# Patient Record
Sex: Female | Born: 1960 | Race: White | Hispanic: No | Marital: Married | Smoking: Former smoker
Health system: Southern US, Community
[De-identification: ages and names within clinical notes are randomized; demographics above are authoritative.]

## PROBLEM LIST (undated history)

## (undated) DIAGNOSIS — T7840XA Allergy, unspecified, initial encounter: Secondary | ICD-10-CM

## (undated) DIAGNOSIS — E559 Vitamin D deficiency, unspecified: Secondary | ICD-10-CM

## (undated) DIAGNOSIS — K5790 Diverticulosis of intestine, part unspecified, without perforation or abscess without bleeding: Secondary | ICD-10-CM

## (undated) DIAGNOSIS — J449 Chronic obstructive pulmonary disease, unspecified: Secondary | ICD-10-CM

## (undated) DIAGNOSIS — E538 Deficiency of other specified B group vitamins: Secondary | ICD-10-CM

## (undated) HISTORY — PX: ANKLE SURGERY: SHX546

## (undated) HISTORY — DX: Deficiency of other specified B group vitamins: E53.8

## (undated) HISTORY — PX: ABDOMINAL HYSTERECTOMY: SHX81

## (undated) HISTORY — DX: Allergy, unspecified, initial encounter: T78.40XA

## (undated) HISTORY — PX: CERVICAL SPINE SURGERY: SHX589

## (undated) HISTORY — DX: Vitamin D deficiency, unspecified: E55.9

## (undated) HISTORY — PX: SHOULDER SURGERY: SHX246

---

## 1998-03-12 ENCOUNTER — Other Ambulatory Visit: Admission: RE | Admit: 1998-03-12 | Discharge: 1998-03-12 | Payer: Self-pay | Admitting: Obstetrics and Gynecology

## 1998-07-12 ENCOUNTER — Ambulatory Visit (HOSPITAL_COMMUNITY): Admission: RE | Admit: 1998-07-12 | Discharge: 1998-07-12 | Payer: Self-pay | Admitting: Neurosurgery

## 1998-10-30 ENCOUNTER — Encounter: Payer: Self-pay | Admitting: Emergency Medicine

## 1998-10-30 ENCOUNTER — Emergency Department (HOSPITAL_COMMUNITY): Admission: EM | Admit: 1998-10-30 | Discharge: 1998-10-30 | Payer: Self-pay | Admitting: Emergency Medicine

## 1999-01-14 ENCOUNTER — Ambulatory Visit (HOSPITAL_BASED_OUTPATIENT_CLINIC_OR_DEPARTMENT_OTHER): Admission: RE | Admit: 1999-01-14 | Discharge: 1999-01-14 | Payer: Self-pay | Admitting: Orthopaedic Surgery

## 1999-03-17 ENCOUNTER — Other Ambulatory Visit: Admission: RE | Admit: 1999-03-17 | Discharge: 1999-03-17 | Payer: Self-pay | Admitting: Obstetrics and Gynecology

## 2000-04-16 ENCOUNTER — Other Ambulatory Visit: Admission: RE | Admit: 2000-04-16 | Discharge: 2000-04-16 | Payer: Self-pay | Admitting: Obstetrics and Gynecology

## 2000-08-14 ENCOUNTER — Encounter: Payer: Self-pay | Admitting: Neurosurgery

## 2000-08-14 ENCOUNTER — Ambulatory Visit (HOSPITAL_COMMUNITY): Admission: RE | Admit: 2000-08-14 | Discharge: 2000-08-14 | Payer: Self-pay | Admitting: Neurosurgery

## 2000-08-24 ENCOUNTER — Inpatient Hospital Stay (HOSPITAL_COMMUNITY): Admission: RE | Admit: 2000-08-24 | Discharge: 2000-08-26 | Payer: Self-pay | Admitting: Neurosurgery

## 2000-08-24 ENCOUNTER — Encounter: Payer: Self-pay | Admitting: Neurosurgery

## 2000-09-07 ENCOUNTER — Encounter: Admission: RE | Admit: 2000-09-07 | Discharge: 2000-09-07 | Payer: Self-pay | Admitting: Neurosurgery

## 2000-09-07 ENCOUNTER — Encounter: Payer: Self-pay | Admitting: Neurosurgery

## 2000-10-06 ENCOUNTER — Encounter: Admission: RE | Admit: 2000-10-06 | Discharge: 2000-10-06 | Payer: Self-pay | Admitting: Neurosurgery

## 2000-10-06 ENCOUNTER — Encounter: Payer: Self-pay | Admitting: Neurosurgery

## 2000-12-27 ENCOUNTER — Encounter: Payer: Self-pay | Admitting: Neurosurgery

## 2000-12-27 ENCOUNTER — Encounter: Admission: RE | Admit: 2000-12-27 | Discharge: 2000-12-27 | Payer: Self-pay | Admitting: Neurosurgery

## 2001-04-19 ENCOUNTER — Other Ambulatory Visit: Admission: RE | Admit: 2001-04-19 | Discharge: 2001-04-19 | Payer: Self-pay | Admitting: Obstetrics and Gynecology

## 2002-06-29 ENCOUNTER — Other Ambulatory Visit: Admission: RE | Admit: 2002-06-29 | Discharge: 2002-06-29 | Payer: Self-pay | Admitting: Obstetrics and Gynecology

## 2002-11-17 ENCOUNTER — Ambulatory Visit (HOSPITAL_COMMUNITY): Admission: RE | Admit: 2002-11-17 | Discharge: 2002-11-17 | Payer: Self-pay | Admitting: Obstetrics and Gynecology

## 2002-11-17 ENCOUNTER — Encounter: Payer: Self-pay | Admitting: Obstetrics and Gynecology

## 2003-08-16 ENCOUNTER — Other Ambulatory Visit: Admission: RE | Admit: 2003-08-16 | Discharge: 2003-08-16 | Payer: Self-pay | Admitting: Obstetrics and Gynecology

## 2004-10-13 ENCOUNTER — Other Ambulatory Visit: Admission: RE | Admit: 2004-10-13 | Discharge: 2004-10-13 | Payer: Self-pay | Admitting: Obstetrics and Gynecology

## 2005-11-25 ENCOUNTER — Other Ambulatory Visit: Admission: RE | Admit: 2005-11-25 | Discharge: 2005-11-25 | Payer: Self-pay | Admitting: Obstetrics and Gynecology

## 2006-02-03 ENCOUNTER — Encounter: Payer: Self-pay | Admitting: Neurosurgery

## 2008-12-22 ENCOUNTER — Encounter: Admission: RE | Admit: 2008-12-22 | Discharge: 2008-12-22 | Payer: Self-pay | Admitting: Podiatry

## 2012-08-01 ENCOUNTER — Other Ambulatory Visit: Payer: Self-pay | Admitting: Obstetrics and Gynecology

## 2012-08-01 DIAGNOSIS — R928 Other abnormal and inconclusive findings on diagnostic imaging of breast: Secondary | ICD-10-CM

## 2012-08-09 ENCOUNTER — Ambulatory Visit
Admission: RE | Admit: 2012-08-09 | Discharge: 2012-08-09 | Disposition: A | Discharge status: Home / Self Care | Payer: Self-pay | Source: Ambulatory Visit | Attending: Obstetrics and Gynecology | Admitting: Obstetrics and Gynecology

## 2012-08-09 ENCOUNTER — Ambulatory Visit
Admission: RE | Admit: 2012-08-09 | Discharge: 2012-08-09 | Disposition: A | Discharge status: Home / Self Care | Payer: BC Managed Care – PPO | Source: Ambulatory Visit | Attending: Obstetrics and Gynecology | Admitting: Obstetrics and Gynecology

## 2012-08-09 DIAGNOSIS — R928 Other abnormal and inconclusive findings on diagnostic imaging of breast: Secondary | ICD-10-CM

## 2014-03-13 ENCOUNTER — Ambulatory Visit (INDEPENDENT_AMBULATORY_CARE_PROVIDER_SITE_OTHER): Payer: BC Managed Care – PPO

## 2014-03-13 ENCOUNTER — Ambulatory Visit (INDEPENDENT_AMBULATORY_CARE_PROVIDER_SITE_OTHER): Payer: BC Managed Care – PPO | Admitting: Internal Medicine

## 2014-03-13 VITALS — BP 134/84 | HR 87 | Temp 97.5°F | Resp 16 | Ht 63.0 in | Wt 154.0 lb

## 2014-03-13 DIAGNOSIS — M79676 Pain in unspecified toe(s): Secondary | ICD-10-CM

## 2014-03-13 DIAGNOSIS — Z23 Encounter for immunization: Secondary | ICD-10-CM

## 2014-03-13 DIAGNOSIS — S91109A Unspecified open wound of unspecified toe(s) without damage to nail, initial encounter: Secondary | ICD-10-CM

## 2014-03-13 DIAGNOSIS — M79609 Pain in unspecified limb: Secondary | ICD-10-CM

## 2014-03-13 MED ORDER — HYDROCODONE-ACETAMINOPHEN 5-325 MG PO TABS: 1.0000 | ORAL_TABLET | Freq: Three times a day (TID) | ORAL | Status: DC | PRN

## 2014-03-13 NOTE — Progress Notes (Signed)
   Subjective:    Patient ID: Deborah May, female    DOB: 1961-03-19, 53 y.o.   MRN: [id number]  HPI 53 year old female presents for evaluation of a puncture wound of her left 4th toe. Injury occurred today while setting up for a yard sale.  She dropped the wooden sign and a screw landed directly on her toe nail. There was pain and bleeding immediately. Admits her toe feels slightly "numb" and she is having pain with ROM.   Last tetanus unknown.   Review of Systems  Musculoskeletal: Positive for arthralgias.  Skin: Positive for wound.  Neurological: Positive for weakness and numbness.       Objective:   Physical Exam  Constitutional: She is oriented to person, place, and time. She appears well-developed and well-nourished.  HENT:  Head: Normocephalic and atraumatic.  Right Ear: External ear normal.  Left Ear: External ear normal.  Eyes: Conjunctivae are normal.  Neck: Normal range of motion.  Cardiovascular: Normal rate.   Pulmonary/Chest: Effort normal.  Musculoskeletal:  Slight TTP of left 4th toe.  +puncture wound of nail with bright red bloody drainage. No subungual hematoma. Capillary refill normal <2 seconds.   Neurological: She is alert and oriented to person, place, and time.  Psychiatric: She has a normal mood and affect. Her behavior is normal. Judgment and thought content normal.      [facility] reading (PRIMARY) by  Dr. Perrin Maltese as no acute bony abnormality or FB noted in the 4th toe.       Assessment & Plan:   Toe pain - Plan: DG Toe 4th Left, HYDROcodone-acetaminophen (NORCO) 5-325 MG per tablet  Wound, open, toe  Placed in post op shoe for comfort.  Tdap updated Recommend warm, soapy soaks 2-3 times daily RTC precautions discussed.  Follow up if symptoms worsening or fail to improve.

## 2014-07-08 ENCOUNTER — Ambulatory Visit (INDEPENDENT_AMBULATORY_CARE_PROVIDER_SITE_OTHER): Payer: BC Managed Care – PPO

## 2014-07-08 ENCOUNTER — Ambulatory Visit (INDEPENDENT_AMBULATORY_CARE_PROVIDER_SITE_OTHER): Payer: BC Managed Care – PPO | Admitting: Emergency Medicine

## 2014-07-08 VITALS — BP 130/80 | HR 75 | Temp 98.5°F | Resp 20 | Ht 63.25 in | Wt 150.2 lb

## 2014-07-08 DIAGNOSIS — J189 Pneumonia, unspecified organism: Secondary | ICD-10-CM

## 2014-07-08 DIAGNOSIS — R05 Cough: Secondary | ICD-10-CM

## 2014-07-08 DIAGNOSIS — R059 Cough, unspecified: Secondary | ICD-10-CM

## 2014-07-08 MED ORDER — PROMETHAZINE-CODEINE 6.25-10 MG/5ML PO SYRP: 5.0000 mL | ORAL_SOLUTION | Freq: Four times a day (QID) | ORAL | Status: DC | PRN

## 2014-07-08 MED ORDER — LEVOFLOXACIN 500 MG PO TABS
500.0000 mg | ORAL_TABLET | Freq: Every day | ORAL | Status: AC
Start: 2014-07-08 — End: 2014-07-18

## 2014-07-08 NOTE — Progress Notes (Signed)
[facility] [address], [place] [address] [phone]  Date:  07/08/2014   Name:  Deborah May   DOB:  1961-04-11   MRN:  [id number]  PCP:  No PCP Per Patient    Chief Complaint: URI and Itchy Scalp   History of Present Illness:  Deborah May is a 53 y.o. very pleasant female patient who presents with the following:  Ill this week with nasal congestion and post nasal drainage.  Temp to 102.3.   Has a cough, occasional wheezing, and exertional shortness of breath. Cough productive purulent sputum. Malaise, fatigue.  No nausea or vomiting.  No stool change No rash.  No improvement with over the counter medications or other home remedies.  Denies other complaint or health concern today.   There are no active problems to display for this patient.   History reviewed. No pertinent past medical history.  Past Surgical History  Procedure Laterality Date  . Abdominal hysterectomy    . Ankle surgery    . Cervical spine surgery      History  Substance Use Topics  . Smoking status: Current Every Day Smoker -- 1.00 packs/day    Types: Cigarettes  . Smokeless tobacco: Never Used  . Alcohol Use: Yes     Comment: occ glass of wine    Family History  Problem Relation Age of Onset  . Congestive Heart Failure Father   . Cancer Sister     Allergies  Allergen Reactions  . Other     Mycins and cyclins per patient     Medication list has been reviewed and updated.  Current Outpatient Prescriptions on File Prior to Visit  Medication Sig Dispense Refill  . loratadine (CLARITIN) 10 MG tablet Take 10 mg by mouth daily.      . Cholecalciferol (VITAMIN D3) 3000 UNITS TABS Take by mouth.      Marland Kitchen. HYDROcodone-acetaminophen (NORCO) 5-325 MG per tablet Take 1 tablet by mouth every 8 (eight) hours as needed.  20 tablet  0   No current facility-administered medications on file prior to visit.    Review of Systems:  As per HPI, otherwise negative.     Physical Examination: Filed Vitals:   07/08/14 1231  BP: 130/80  Pulse: 75  Temp: 98.5 F (36.9 C)  Resp: 20   Filed Vitals:   07/08/14 1231  Height: 5' 3.25" (1.607 m)  Weight: 150 lb 4 oz (68.153 kg)   Body mass index is 26.39 kg/(m^2). Ideal Body Weight: Weight in (lb) to have BMI = 25: 142  GEN: WDWN, NAD, Non-toxic, A & O x 3.  Persistent cough.   HEENT: Atraumatic, Normocephalic. Neck supple. No masses, No LAD. Ears and Nose: No external deformity. CV: RRR, No M/G/R. No JVD. No thrill. No extra heart sounds. PULM: CTA B, no wheezes, crackles, rhonchi. No retractions. No resp. distress. No accessory muscle use. ABD: S, NT, ND, +BS. No rebound. No HSM. EXTR: No c/c/e NEURO Normal gait.  PSYCH: Normally interactive. Conversant. Not depressed or anxious appearing.  Calm demeanor.    Assessment and Plan: Pneumonia levaquin prometh c codeine   Signed,  Phillips OdorJeffery Jarrah Babich, MD   [facility] reading (PRIMARY) by  Dr. Dareen Pianoanderson. Right infiltrate.

## 2014-07-08 NOTE — Patient Instructions (Signed)

## 2014-07-21 ENCOUNTER — Telehealth: Payer: Self-pay

## 2014-07-21 ENCOUNTER — Ambulatory Visit (INDEPENDENT_AMBULATORY_CARE_PROVIDER_SITE_OTHER): Payer: BC Managed Care – PPO | Admitting: Family Medicine

## 2014-07-21 VITALS — BP 136/80 | HR 92 | Temp 97.5°F | Resp 16 | Ht 63.5 in | Wt 150.2 lb

## 2014-07-21 DIAGNOSIS — J189 Pneumonia, unspecified organism: Secondary | ICD-10-CM

## 2014-07-21 DIAGNOSIS — B850 Pediculosis due to Pediculus humanus capitis: Secondary | ICD-10-CM

## 2014-07-21 MED ORDER — PERMETHRIN 5 % EX CREA: 1.0000 "application " | TOPICAL_CREAM | Freq: Once | CUTANEOUS | Status: DC

## 2014-07-21 MED ORDER — LEVOFLOXACIN 750 MG PO TABS: 750.0000 mg | ORAL_TABLET | Freq: Every day | ORAL | Status: DC

## 2014-07-21 NOTE — Progress Notes (Signed)
Subjective:    Patient ID: Deborah May, female    DOB: 02-01-1961, 53 y.o.   MRN: [id number]  Sinusitis Associated symptoms include congestion, coughing and sinus pressure. Pertinent negatives include no chills, diaphoresis, ear pain, headaches, shortness of breath or sore throat.   Chief Complaint  Patient presents with  . Sinusitis    sinus congestion, chest congestion.  cough with congestion--clear.  treated herself x 2 for head lice.  feels she still has this.     This chart was scribed for Ethelda ChickKristi M Shams Fill, MD, MD by Andrew Auaven Small, ED Scribe. This patient was seen in room 13 and the patient's care was started at 5:17 PM.  HPI Comments: Deborah May is a 53 y.o. female who [facility]/u regarding pneumonia. Pt was seen 10/4 by Dr. Dareen PianoAnderson and diagnoses with pneumonia confirmed with CXR. Pt was prescribed levaquin 500 mg daily for ten days. Pt states she was getting significantly better from pneumonia but her husband was sick and she had recently stayed in the hospital over night visiting a friend which she believes triggered sinusitis x 1 week. She reports sinus congestion, chest congestion, and cough consisting of white/clear sputum. Pt states she ran out levaquin one week ago.  Pt denies fever, SOB wheezing, sore throat. Pt smokes 4 cigarettes a day but used to smoke .5 - 1 pack a day. Pt denies chronic health problems.  She feels that she has a new illness.  She is resistant to undergo repeat CXR.  No recent fevers in the past week.   Pt also complains of improving head lice that's began 2 weeks ago. 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 Pt states she was diagnosed by the school nurse. Pt states she has treated lice with multiple product for the past 2 weeks with anti itch shampoo, nix, mayonnaise, cetaphil and selsun blue. She is also constantly washing her sheets and clothes. Pt last treated head with  Nix 2 days ago but her husband removed 2 lice from head this morning. Pt states she wakes up during the night and is constantly scratching her head. She reports she has worsening itching to posterior neck and behind ears.   Past Medical History  Diagnosis Date  . Allergy    Past Surgical History  Procedure Laterality Date  . Abdominal hysterectomy    . Ankle surgery    . Cervical spine surgery     Prior to Admission medications   Medication Sig Start Date End Date Taking? Authorizing Provider  promethazine-codeine (PHENERGAN WITH CODEINE) 6.25-10 MG/5ML syrup Take 5-10 mLs by mouth every 6 (six) hours as needed. 07/08/14  Yes Carmelina DaneJeffery S Anderson, MD  Cholecalciferol (VITAMIN D3) 3000 UNITS TABS Take by mouth.    Historical Provider, MD  HYDROcodone-acetaminophen (NORCO) 5-325 MG per tablet Take 1 tablet by mouth every 8 (eight) hours as needed. 03/13/14   Heather Jaquita RectorM Marte, PA-C  loratadine (CLARITIN) 10 MG tablet Take 10 mg by mouth daily.    Historical Provider, MD  penicillin v potassium (VEETID) 500 MG tablet Take 500 mg by mouth every 6 (six) hours.    Historical Provider, MD   Review of Systems  Constitutional: Negative for fever, chills, diaphoresis and fatigue.  HENT: Positive for congestion, rhinorrhea, sinus pressure and voice change. Negative for ear pain and sore throat.   Respiratory: Positive for cough. Negative for shortness of  breath, wheezing and stridor.   Gastrointestinal: Negative for nausea, vomiting and diarrhea.  Neurological: Negative for headaches.   Objective:   Physical Exam  Nursing note and vitals reviewed. Constitutional: She is oriented to person, place, and time. She appears well-developed and well-nourished. No distress.  HENT:  Head: Normocephalic and atraumatic.  Right Ear: External ear normal.  Left Ear: External ear normal.  Nose: Nose normal. Right sinus exhibits no maxillary sinus tenderness and no frontal sinus tenderness. Left sinus exhibits no  maxillary sinus tenderness and no frontal sinus tenderness.  Mouth/Throat: Oropharynx is clear and moist.  No lice or nits visualized.   Eyes: Conjunctivae and EOM are normal.  Neck: Neck supple.  Cardiovascular: Normal rate, regular rhythm and normal heart sounds.  Exam reveals no gallop and no friction rub.   No murmur heard. Pulmonary/Chest: Effort normal and breath sounds normal. No respiratory distress. She has no wheezes. She has no rales. She exhibits no tenderness.  Musculoskeletal: Normal range of motion.  Lymphadenopathy:    She has no cervical adenopathy.  Neurological: She is alert and oriented to person, place, and time.  Skin: Skin is warm and dry. No rash noted. She is not diaphoretic. No erythema.  +excoriations posterior neck.  Psychiatric: She has a normal mood and affect. Her behavior is normal.     Assessment & Plan:   1. CAP (community acquired pneumonia)   2. Head lice    1. Community acquired pneumonia: improving but recent worsening symptoms or suffering with new acute illness. Pt declined repeat CXR today; pt was advised to undergo repeat CXR in upcoming 2-3 months to confirm resolution of pneumonia especially with tobacco hx/active use.  Rx for Levaquin 750mg  daily for seven days.  Recommend Mucinex DM bid. 2.  Head lice: New. Rx for Elimite cream % provided to apply to scalp and hair.  S/p OTC Nix shampoo x 2.  Call if no improvement in two weeks.   Meds ordered this encounter  Medications  . levofloxacin (LEVAQUIN) 750 MG tablet    Sig: Take 1 tablet (750 mg total) by mouth daily.    Dispense:  7 tablet    Refill:  0  . DISCONTD: permethrin (ELIMITE) 5 % cream    Sig: Apply 1 application topically once. Leave in place for eight hours and then wash off.  Repeat in 1-2 weeks if necessary.    Dispense:  60 g    Refill:  0  . permethrin (ELIMITE) 5 % cream    Sig: Apply 1 application topically once. Leave in place for eight hours and then wash off.  Repeat  in 1-2 weeks if necessary.    Dispense:  60 g    Refill:  0     I personally performed the services described in this documentation, which was scribed in my presence. The recorded information has been reviewed and is accurate.  Nilda SimmerKristi Kyrian Stage, M.D.  [facility]  [place] [address] [place], [place]  [address] [phone] phone [phone] fax

## 2014-07-21 NOTE — Telephone Encounter (Signed)
Patient called stating pharmacist told her to call and see if we could prescribe another bottle of Elimite since she has long hair and the one tube might not be enough. Dr. Katrinka BlazingSmith approved sent in another RX. Patient notified and voiced understanding.

## 2014-07-21 NOTE — Patient Instructions (Signed)
1. RECOMMEND MUCINEX DM ONE TABLET TWICE DAILY. 2. CONTINUE MUCINEX NIGHTTIME. 3. RETURN IN 2-3 MONTHS FOR REPEAT CHEST XRAY TO CONFIRM THAT PNEUMONIA HAS COMPLETELY RESOLVED.

## 2014-07-21 NOTE — Telephone Encounter (Signed)
°   permethrin (ELIMITE) 5 % cream Patient was advised by pharmacist to get a second tube of medication due to long hair.    [phone] (M)   [facility] [address]

## 2014-07-30 ENCOUNTER — Telehealth: Payer: Self-pay | Admitting: Family Medicine

## 2014-07-30 DIAGNOSIS — B850 Pediculosis due to Pediculus humanus capitis: Secondary | ICD-10-CM

## 2014-07-30 MED ORDER — PERMETHRIN 5 % EX CREA: 1.0000 "application " | TOPICAL_CREAM | Freq: Once | CUTANEOUS | Status: DC

## 2014-07-30 NOTE — Telephone Encounter (Signed)
Patient had lice. States that more eggs have hatched and her head will not stop itching. Patient used Nix to help with this but is requesting another refill on her medication. Please advise. [facility] [address]  [phone]

## 2014-07-30 NOTE — Telephone Encounter (Signed)
rx sent

## 2014-07-30 NOTE — Telephone Encounter (Signed)
Pt calling to get the refill on Elimite cream- told to repeat in 1 week and no refill was given at the pharmacy.  Please advise.

## 2014-07-30 NOTE — Telephone Encounter (Signed)
Pt advised.

## 2016-01-02 ENCOUNTER — Telehealth: Payer: Self-pay | Admitting: Genetic Counselor

## 2016-01-02 ENCOUNTER — Encounter: Payer: Self-pay | Admitting: Genetic Counselor

## 2016-01-02 NOTE — Telephone Encounter (Signed)
Verified insurance and address, faxed referring letter, mailed new pt packet, scheduled intake

## 2016-01-30 ENCOUNTER — Telehealth: Payer: Self-pay | Admitting: Genetic Counselor

## 2016-01-30 NOTE — Telephone Encounter (Signed)
Returned pt call regarding genetic counseling insurance questions and concerns for her upcoming appt.

## 2016-02-11 ENCOUNTER — Ambulatory Visit (HOSPITAL_BASED_OUTPATIENT_CLINIC_OR_DEPARTMENT_OTHER): Payer: BLUE CROSS/BLUE SHIELD | Admitting: Genetic Counselor

## 2016-02-11 ENCOUNTER — Other Ambulatory Visit: Payer: BLUE CROSS/BLUE SHIELD

## 2016-02-11 DIAGNOSIS — Z801 Family history of malignant neoplasm of trachea, bronchus and lung: Secondary | ICD-10-CM

## 2016-02-11 DIAGNOSIS — Z8041 Family history of malignant neoplasm of ovary: Secondary | ICD-10-CM

## 2016-02-11 DIAGNOSIS — Z803 Family history of malignant neoplasm of breast: Secondary | ICD-10-CM

## 2016-02-11 DIAGNOSIS — Z800 Family history of malignant neoplasm of digestive organs: Secondary | ICD-10-CM

## 2016-02-11 DIAGNOSIS — Z315 Encounter for genetic counseling: Secondary | ICD-10-CM

## 2016-02-11 DIAGNOSIS — Z808 Family history of malignant neoplasm of other organs or systems: Secondary | ICD-10-CM | POA: Diagnosis not present

## 2016-02-12 ENCOUNTER — Encounter: Payer: Self-pay | Admitting: Genetic Counselor

## 2016-02-12 DIAGNOSIS — Z803 Family history of malignant neoplasm of breast: Secondary | ICD-10-CM | POA: Insufficient documentation

## 2016-02-12 DIAGNOSIS — Z8041 Family history of malignant neoplasm of ovary: Secondary | ICD-10-CM | POA: Insufficient documentation

## 2016-02-12 NOTE — Progress Notes (Signed)
REFERRING PROVIDER: Arvella Nigh, MD [address], [place] [address]  PRIMARY PROVIDER:  No PCP Per Patient  PRIMARY REASON FOR VISIT:  1. Family history of ovarian cancer   2. Family history of breast cancer in female   3. Family history of cancer of GI tract   4. Family history of skin cancer   5. Family history of liver cancer   6. Family history of lung cancer      HISTORY OF PRESENT ILLNESS:   Deborah May, a 55 y.o. female, was seen for a [place] cancer genetics consultation at the request of Dr. Radene Knee due to a family history of ovarian, breast, appendiceal, and other cancers.  Deborah May had negative BRCAvantage testing (sequencing and deletion/duplication analysis of BRCA1/2 genes) in January 2017 through [facility], as ordered by her doctor's office.  Deborah May presents to clinic today to discuss the possibility of a hereditary predisposition to cancer, further genetic testing options, and to further clarify her future cancer risks, as well as potential cancer risks for family members.    Deborah May is a 55 y.o. female with no personal history of cancer.    HORMONAL RISK FACTORS:  Menarche was at age 40.  First live birth at age 29.  OCP use for approximately 3-4 years.  Ovaries intact: yes.  Hysterectomy: yes, at the age of 23 due to endometriosis and abnormal bleeding.  Menopausal status: postmenopausal.  HRT use: 0 years. Colonoscopy: yes; most recent was approx 1.5-78yr ago and two polyps were removed at that time. Mammogram within the last year: yes; history of cysts with aspirations Number of breast biopsies: 0. Up to date with pelvic exams:  yes. Any excessive radiation exposure in the past:  no, but does report additional history of secondhand smoke exposure  Past Medical History  Diagnosis Date  . Allergy     Past Surgical History  Procedure Laterality Date  . Abdominal hysterectomy    . Ankle surgery    .  Cervical spine surgery      Social History   Social History  . Marital Status: Married    Spouse Name: N/A  . Number of Children: N/A  . Years of Education: N/A   Social History Main Topics  . Smoking status: Current Every Day Smoker -- 0.50 packs/day for 38 years    Types: Cigarettes  . Smokeless tobacco: Never Used     Comment: never quite 1ppd  . Alcohol Use: Yes     Comment: occ glass of wine/ 3-4 drinks per month  . Drug Use: No  . Sexual Activity: Not Asked   Other Topics Concern  . None   Social History Narrative     FAMILY HISTORY:  We obtained a detailed, 4-generation family history.  Significant diagnoses are listed below: Family History  Problem Relation Age of Onset  . Congestive Heart Failure Father 737 . Cancer Sister 590   appendiceal  . ALS Mother   . Ovarian cancer Maternal Aunt 80  . Ovarian cancer Maternal Grandmother 67  . Stroke Paternal Grandmother   . Angina Paternal Grandmother   . Lung cancer Paternal Grandfather     smoker; d. 567s-60s . Breast cancer Other 33  . Stroke Maternal Aunt   . Mesothelioma Maternal Uncle     [place]60 . Skin cancer Cousin     maternal 1st cousin w/ NOS skin cancer  .  Breast cancer Cousin 71    maternal 1st cousin  . Liver cancer Cousin     maternal 1st cousin d. late 52s; +cirrhosis from alcohol abuse  . Endometriosis Cousin     s/p TAH  . Other Cousin     hx of benign teratoma  . Alzheimer's disease Other     paternal great aunt; late-onset  . Heart attack Other     (x2 paternal great uncles) and one paternal great aunt in their 56s-80s    Deborah May has one daughter who is currently 59 and has never had cancer.  Her daughter does have a history of an abnormal spot of skin between her toes that was frozen.  Deborah May has one young grandson and granddaughter.  Deborah May full sister was diagnosed with appendiceal cancer at 70 and passed away at 37.  Her sister had  two daughters, one of whom was diagnosed with breast cancer at 55.  Deborah May mother died of ALS at the age of 87.  Her father died of congestive heart failure at 19.  Deborah May mother had three full sisters and two full brothers.  One sister died of ovarian cancer at the age of 57.  She had four daughters and one son, and two of her daughters have had cancer.  One of her daughters was diagnosed with breast cancer at the age of 52 and the other was diagnosed with an unspecified type of skin cancer.  Two other of Deborah May's maternal aunts are in their late 71s-early 80s and have not had cancer.  One of these aunts has a son who died of liver cancer in his late 50s.  He had liver cirrhosis due to alcohol abuse.  Deborah May maternal uncles have passed away--one died in a car accident in his early 52s; the other died of mesothelioma close to the age of 47.  Deborah May maternal grandmother was diagnosed with ovarian cancer at 26 and passed away at 103.  Deborah May maternal grandfather died of alcohol abuse in his 53s.  Deborah May reports either an uncle or cousin on her maternal grandfather's side of the family with brain cancer, but she is not quite sure how this relative is related to her.  She has no further information for maternal great aunts/uncles or great grandparents.  Deborah May father had one full sister who is currently 53 and who has never had cancer.  She has two daughters and one son who are all cancer free.  One daughter has a history of endometriosis for which she underwent a hysterectomy, and she also has a history of a benign teratoma.  Deborah May paternal grandmother died of stroke and angina at the age of 69.  Deborah May reports that her grandmother has one sister who is still alive at 25, has never had cancer and who has alzheimer's.  Her grandmother also had another sister and two brothers, all of whom died of heart attacks in their 47s-80s.  Deborah May  grandfather, a smoker, died of lung cancer in his 35s-60s.    Patient's maternal ancestors are of [place], [place], and Native American descent, and paternal ancestors are of [place], [place], and Native American- [place] descent. There is no reported Ashkenazi Jewish ancestry. There is no known consanguinity.  GENETIC COUNSELING ASSESSMENT: YASLYN CUMBY is a 55 y.o. female with a family history of breast and ovarian cancer which is somewhat suggestive of a hereditary breast/ovarian cancer syndrome and predisposition  to cancer. We, therefore, discussed and recommended the following at today's visit.   DISCUSSION: We reviewed the characteristics, features and inheritance patterns of hereditary cancer syndromes, such as those caused by mutations within the BRIP1 and Lynch syndrome genes. We discussed that we would expect testing of the BRCA1 and BRCA2 genes to be negative upon genetic testing once again.  We also discussed genetic testing, including the appropriate family members to test, the process of testing, insurance coverage and cost of testing, and turn-around-time for results. We discussed the implications of a negative, positive and/or variant of uncertain significant result. We recommended Ms. Marcos pursue genetic testing for the 30-gene Hereditary Cancer Panel through [facility].  The 30-gene Panel through [facility] [place], [place]) includes sequencing and/or deletion/duplication analysis of the following genes: APC, ATM, BAP1, BARD1, BMPR1A, BRCA1, BRCA2, BRIP1, CDH1, CDK4, CDKN2A, CHEK2, EPCAM, GREM1, MITF, MLH1, MSH2, MSH6, MUTYH, NBN, PALB2, PMS2, POLD1, POLE, PTEN, RAD51C, RAD51D, SMAD4, STK11, and TP53.   Based on Ms. Gaultney's family history of cancer, she meets medical criteria for genetic testing. [facility] offers an up-front, self-pay genetic testing option.  Testing through [facility] is discounted for the month of May, so Ms.  Raphael's out-of-pocket cost is approximately $109.  Ms. Abplanalp provided consent for testing and a saliva sample was sent to [facility].    PLAN: After considering the risks, benefits, and limitations, Ms. Fayson  provided informed consent to pursue genetic testing and the saliva sample was sent to [facility] for analysis of the 30-gene Hereditary Cancer Panel. Results should be available within approximately 3-4 weeks' time, at which point they will be disclosed by telephone to Ms. Estis, as will any additional recommendations warranted by these results. Ms. Raupp will receive a summary of her genetic counseling visit and a copy of her results once available. This information will also be available in Epic. We encouraged Ms. Musolf to remain in contact with cancer genetics annually so that we can continuously update the family history and inform her of any changes in cancer genetics and testing that may be of benefit for her family. Ms. Maiden questions were answered to her satisfaction today. Our contact information was provided should additional questions or concerns arise.  Thank you for the referral and allowing [place] to share in the care of your patient.   Jeanine Luz, MS, [facility] Certified Genetic Counselor [email or web address] Phone: [phone]  The patient was seen for a total of 65 minutes in face-to-face genetic counseling.  This patient was discussed with Drs. Magrinat, Lindi Adie and/or Burr Medico who agrees with the above.    _______________________________________________________________________ For Office Staff:  Number of people involved in session: 1 Was an Intern/ student involved with case: no

## 2016-02-28 ENCOUNTER — Telehealth: Payer: Self-pay | Admitting: Genetic Counselor

## 2016-02-28 NOTE — Telephone Encounter (Signed)
Discussed with Deborah May that her genetic test results were negative for any known pathogenic mutations within any of 30 genes on the [facility] Hereditary Cancer Panel ([place] in [place], [place]) that would increase her genetic risk for breast, ovarian, colon, or other related cancers.  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  Discussed that we treat this just like a negative test result until it gets updated by the lab.  She should keep her phone number up-to-date with [place], so that we can call and let her know if/when this gets updated.  Discussed that her niece and maternal first cousin who have a history of breast cancer should have more comprehensive genetic testing, as they have only had negative BRCA testing, thus far.  There could be a mutation in the family that explains the family history of breast and ovarian cancer that Deborah May herself just did not inherit.  More comprehensive testing for these affected relatives will give [place] more information about Deborah May's own cancer risks.  Additionally, if this VUS is found in other affected family members, it may make this result more suspicious for [place].  Discussed that she should continue to follow her doctors' recommendations for future cancer screening.  Women in the family are at an increased risk for breast and ovarian cancer based on the family history.  Deborah May would like a copy of her results mailed, so I will send that along with a results letter.  She is welcome to call with any questions.  I also encouraged her to let her relatives know that they could contact me to make appointments/to ask any questions they may have.

## 2016-03-04 ENCOUNTER — Ambulatory Visit: Payer: Self-pay | Admitting: Genetic Counselor

## 2016-03-04 ENCOUNTER — Encounter: Payer: Self-pay | Admitting: Genetic Counselor

## 2016-03-04 DIAGNOSIS — Z809 Family history of malignant neoplasm, unspecified: Secondary | ICD-10-CM

## 2016-03-04 DIAGNOSIS — Z803 Family history of malignant neoplasm of breast: Secondary | ICD-10-CM

## 2016-03-04 DIAGNOSIS — Z1379 Encounter for other screening for genetic and chromosomal anomalies: Secondary | ICD-10-CM | POA: Insufficient documentation

## 2016-03-04 DIAGNOSIS — Z8041 Family history of malignant neoplasm of ovary: Secondary | ICD-10-CM

## 2016-03-04 NOTE — Progress Notes (Signed)
GENETIC TEST RESULT  HPI: Ms. Wohlfarth was previously seen in the [facility] due to a family history of ovarian, breast, and other cancers,and concerns regarding a hereditary predisposition to cancer. Please refer to our prior cancer genetics clinic note from Feb 11, 2016 for more information regarding Ms. Rahe's medical, social and family histories, and our assessment and recommendations, at the time. Ms. Buccieri recent genetic test results were disclosed to her, as were recommendations warranted by these results. These results and recommendations are discussed in more detail below.  GENETIC TEST RESULTS: At the time of Ms. Haglund's visit on 02/11/16, we recommended she pursue more comprehensive genetic testing through the 30-gene Hereditary Cancer Panel through [facility].  The 30-gene Panel through [facility] [place], [place]) includes sequencing and/or deletion/duplication analysis of the following genes: APC, ATM, BAP1, BARD1, BMPR1A, BRCA1, BRCA2, BRIP1, CDH1, CDK4, CDKN2A, CHEK2, EPCAM, GREM1, MITF, MLH1, MSH2, MSH6, MUTYH, NBN, PALB2, PMS2, POLD1, POLE, PTEN, RAD51C, RAD51D, SMAD4, STK11, and TP53.  Those results are now back, the report date for which is Feb 28, 2016.  Genetic testing was normal, and did not reveal a deleterious mutation in these genes. 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  The test report will be scanned into EPIC and will be located under the Results Review tab in the Pathology>Molecular Pathology section.   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 At this time, it is unknown if this VUS is associated with an increased risk for cancer or if this is a normal finding. Since this VUS result is uncertain, it cannot help guide screening recommendations, and family members should not  be tested for this VUS to help define their own cancer risks.  Also, we all have variants within our genes that make [place] unique individuals--most of these variants are benign.  Thus, we treat this VUS as a negative result.   With time, we suspect the lab will reclassify this variant and when they do, we will try to re-contact Ms. Grondin to discuss the reclassification further.  We also encouraged Ms. Tusing to contact us in a year or two to obtain an update on the status of this VUS.  We discussed with Ms. Kessinger that since the current genetic testing is not perfect, it is possible there may be a gene mutation in one of these genes that current testing cannot detect, but that chance is small. We also discussed, that it is possible that another gene that has not yet been discovered, or that we have not yet tested, is responsible for the cancer diagnoses in the family, and it is, therefore, important to remain in touch with cancer genetics in the future so that we can continue to offer Ms. Massingale the most up-to-date genetic testing.   CANCER SCREENING RECOMMENDATIONS: We still do not have an explanation for the family history of breast and ovarian cancer. This normal result may be reassuring and indicate that Ms. Marten does not likely have an increased risk of cancer due to a mutation in one of these genes.  Further genetic testing affected relatives would be helpful to further elucidate Ms. Parzych's own cancer risks.   There is a chance that there is a genetic mutation in the family that explains the family history of cancer, that Ms. Gotwalt herself just did not inherit.  Additionally, if  other affected family members have genetic testing and are found to also carry this same BRIP1 VUS, then this VUS may become more suspicious for [place]. We, therefore, recommend further genetic testing of affected relatives and, in the meantime, we recommend Ms. Thai continue to follow the cancer screening  guidelines provided by her primary healthcare providers.   RECOMMENDATIONS FOR FAMILY MEMBERS: Women in this family might be at some increased risk of developing cancer, over the general population risk, simply due to the family history of cancer. We recommended women in this family have a yearly mammogram beginning at age 36, or 47 years younger than the earliest onset of breast cancer in their close family, an annual clinical breast exam, and perform monthly breast self-exams. Women in this family should also have a gynecological exam as recommended by their primary provider. All family members should have a colonoscopy by age 33.  Based on Ms. Stamper's family history, we recommended her niece, who was diagnosed with breast cancer at age 73 and her maternal first cousin who was diagnosed with breast cancer at 78 (and whose mother died of ovarian cancer), have genetic counseling and testing.  Ms. Liberman reports that these family members have likely already had negative BRCA1/2 testing, however, we recommend that they have more comprehensive panel testing.  Ms. Sterbenz will let us know if we can be of any assistance in coordinating genetic counseling and/or testing for these family members.   FOLLOW-UP: Lastly, we discussed with Ms. Sedano that cancer genetics is a rapidly advancing field and it is possible that new genetic tests will be appropriate for her and/or her family members in the future. We encouraged her to remain in contact with cancer genetics on an annual basis so we can update her personal and family histories and let her know of advances in cancer genetics that may benefit this family.   Our contact number was provided. Ms. Gamel questions were answered to her satisfaction, and she knows she is welcome to call us at anytime with additional questions or concerns.   Jeanine Luz, MS, [facility] Certified Genetic Counselor [email or web address] Phone: [phone]

## 2018-10-25 ENCOUNTER — Ambulatory Visit
Admission: RE | Admit: 2018-10-25 | Discharge: 2018-10-25 | Disposition: A | Discharge status: Home / Self Care | Payer: BLUE CROSS/BLUE SHIELD | Source: Ambulatory Visit | Attending: Internal Medicine | Admitting: Internal Medicine

## 2018-10-25 ENCOUNTER — Other Ambulatory Visit: Payer: Self-pay | Admitting: Internal Medicine

## 2018-10-25 DIAGNOSIS — R059 Cough, unspecified: Secondary | ICD-10-CM

## 2018-10-25 DIAGNOSIS — R05 Cough: Secondary | ICD-10-CM

## 2018-11-02 ENCOUNTER — Other Ambulatory Visit: Payer: Self-pay | Admitting: Obstetrics and Gynecology

## 2018-11-02 DIAGNOSIS — R928 Other abnormal and inconclusive findings on diagnostic imaging of breast: Secondary | ICD-10-CM

## 2018-11-09 ENCOUNTER — Ambulatory Visit
Admission: RE | Admit: 2018-11-09 | Discharge: 2018-11-09 | Disposition: A | Discharge status: Home / Self Care | Payer: BLUE CROSS/BLUE SHIELD | Source: Ambulatory Visit | Attending: Obstetrics and Gynecology | Admitting: Obstetrics and Gynecology

## 2018-11-09 DIAGNOSIS — R928 Other abnormal and inconclusive findings on diagnostic imaging of breast: Secondary | ICD-10-CM

## 2019-03-03 ENCOUNTER — Other Ambulatory Visit: Payer: Self-pay | Admitting: Internal Medicine

## 2019-03-03 ENCOUNTER — Ambulatory Visit
Admission: RE | Admit: 2019-03-03 | Discharge: 2019-03-03 | Disposition: A | Discharge status: Home / Self Care | Payer: BLUE CROSS/BLUE SHIELD | Source: Ambulatory Visit | Attending: Internal Medicine | Admitting: Internal Medicine

## 2019-03-03 DIAGNOSIS — R0609 Other forms of dyspnea: Secondary | ICD-10-CM

## 2019-03-30 ENCOUNTER — Other Ambulatory Visit: Payer: Self-pay

## 2019-03-30 ENCOUNTER — Ambulatory Visit (INDEPENDENT_AMBULATORY_CARE_PROVIDER_SITE_OTHER): Payer: BC Managed Care – PPO | Admitting: Emergency Medicine

## 2019-03-30 ENCOUNTER — Encounter: Payer: Self-pay | Admitting: Emergency Medicine

## 2019-03-30 VITALS — BP 116/86 | HR 69 | Ht 63.0 in | Wt 147.2 lb

## 2019-03-30 DIAGNOSIS — J449 Chronic obstructive pulmonary disease, unspecified: Secondary | ICD-10-CM | POA: Insufficient documentation

## 2019-03-30 DIAGNOSIS — J301 Allergic rhinitis due to pollen: Secondary | ICD-10-CM

## 2019-03-30 DIAGNOSIS — J309 Allergic rhinitis, unspecified: Secondary | ICD-10-CM | POA: Insufficient documentation

## 2019-03-30 DIAGNOSIS — R0600 Dyspnea, unspecified: Secondary | ICD-10-CM | POA: Diagnosis not present

## 2019-03-30 NOTE — Assessment & Plan Note (Signed)
I suspect she does have COPD based on the constellation of symptoms associated with her recent allergy flare, sinusitis.  She also had some hyperinflation on chest x-ray.  Her cyclical seasonal episodes of bronchitis are also consistent.  We will perform pulmonary function testing to quantify her degree of obstruction.  Talked her today about possibly starting maintenance bronchodilator therapy.  This will largely be based on symptoms.  She is already done the most important thing to prevent more rapid progression which is stopping smoking.  I congratulated her on this.  Congratulations on stopping smoking.  This is probably the most important thing you can do for your health.  We will make every effort to ensure that you do not restart. We will perform pulmonary function testing to evaluate for COPD. We will obtain a copy of your chest x-ray for review Follow with Dr. Lamonte Sakai next available after your PFT to review

## 2019-03-30 NOTE — Progress Notes (Signed)
Subjective:    Patient ID: Zebedee IbaJill G Schneiderman, female    DOB: [birth year], 58 y.o.   MRN: [id number]  HPI 58 year old former smoker (30 pack years) with a history of allergic rhinitis, formerly on immunotherapy.  Review of the chart notes that she was treated for a community-acquired pneumonia back in 2015.  She is here today for evaluation of COPD. She had an episode about a month ago with dyspnea when she woke up, UE tingling. Self limited but she had a second similar episode a few days later. Some chest tightness, difficulty moving air through nose, sinuses. CXR was reported as clear. She was treated with levaquin by Dr Kevan NyGates and her head, breathing both improved.   She notes that she had stopped her loratadine about a week before. Restarted now. She stopped smoking with this episode. She has been on albuterol before in past when she had PNA.   She has had episodes of bronchitis with seasonal changes over the years when her allergies flare   Review of Systems  Constitutional: Negative for fever and unexpected weight change.  HENT: Negative for congestion, dental problem, ear pain, nosebleeds, postnasal drip, rhinorrhea, sinus pressure, sneezing, sore throat and trouble swallowing.   Eyes: Negative for redness and itching.  Respiratory: Positive for cough and shortness of breath. Negative for chest tightness and wheezing.   Cardiovascular: Negative for palpitations and leg swelling.  Gastrointestinal: Negative for nausea and vomiting.  Genitourinary: Negative for dysuria.  Musculoskeletal: Negative for joint swelling.  Skin: Negative for rash.  Neurological: Negative for headaches.  Hematological: Does not bruise/bleed easily.  Psychiatric/Behavioral: Negative for dysphoric mood. The patient is not nervous/anxious.     Past Medical History:  Diagnosis Date  . Allergy      Family History  Problem Relation Age of Onset  . Congestive Heart Failure Father 7279  . Cancer Sister 5958    appendiceal  . ALS Mother   . Ovarian cancer Maternal Aunt 80  . Ovarian cancer Maternal Grandmother 1367  . Stroke Paternal Grandmother   . Angina Paternal Grandmother   . Lung cancer Paternal Grandfather        smoker; d. 7050s-60s  . Breast cancer Other 33  . Stroke Maternal Aunt   . Mesothelioma Maternal Uncle        worked in [employer]; d. approx 80  . Skin cancer Cousin        maternal 1st cousin w/ NOS skin cancer  . Breast cancer Cousin 1551       maternal 1st cousin  . Liver cancer Cousin        maternal 1st cousin d. late 4640s; +cirrhosis from alcohol abuse  . Endometriosis Cousin        s/p TAH  . Other Cousin        hx of benign teratoma  . Alzheimer's disease Other        paternal great aunt; late-onset  . Heart attack Other        (x2 paternal great uncles) and one paternal great aunt in their 6370s-80s     Social History   Socioeconomic History  . Marital status: Married    Spouse name: Not on file  . Number of children: Not on file  . Years of education: Not on file  . Highest education level: Not on file  Occupational History  . Not on file  Social Needs  . Financial resource strain: Not on file  . Food insecurity  Worry: Not on file    Inability: Not on file  . Transportation needs    Medical: Not on file    Non-medical: Not on file  Tobacco Use  . Smoking status: Former Smoker    Packs/day: 0.75    Years: 40.00    Pack years: 30.00    Types: Cigarettes    Quit date: 02/21/2019    Years since quitting: 0.1  . Smokeless tobacco: Never Used  . Tobacco comment: never quite 1ppd  Substance and Sexual Activity  . Alcohol use: Yes    Comment: occ glass of wine/ 3-4 drinks per month  . Drug use: No  . Sexual activity: Not on file  Lifestyle  . Physical activity    Days per week: Not on file    Minutes per session: Not on file  . Stress: Not on file  Relationships  . Social Herbalist on phone: Not on file    Gets together: Not  on file    Attends religious service: Not on file    Active member of club or organization: Not on file    Attends meetings of clubs or organizations: Not on file    Relationship status: Not on file  . Intimate partner violence    Fear of current or ex partner: Not on file    Emotionally abused: Not on file    Physically abused: Not on file    Forced sexual activity: Not on file  Other Topics Concern  . Not on file  Social History Narrative  . 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   Allergies  Allergen Reactions  . Cephalosporins   . Macrolides And Ketolides   . Other     Mycins and cyclins per patient   . Tetracyclines & Related      Outpatient Medications Prior to Visit  Medication Sig Dispense Refill  . Cholecalciferol (VITAMIN D3) 50 MCG (2000 UT) capsule Take 2,000 Units by mouth daily.    Marland Kitchen loratadine (CLARITIN) 10 MG tablet Take 10 mg by mouth daily.    . Multiple Vitamin (MULTIVITAMIN) tablet Take 1 tablet by mouth daily.    . vitamin B-12 (CYANOCOBALAMIN) 1000 MCG tablet Take 1,000 mcg by mouth daily.    . Cholecalciferol (VITAMIN D3) 3000 UNITS TABS Take by mouth.    Marland Kitchen HYDROcodone-acetaminophen (NORCO) 5-325 MG per tablet Take 1 tablet by mouth every 8 (eight) hours as needed. 20 tablet 0  . levofloxacin (LEVAQUIN) 750 MG tablet Take 1 tablet (750 mg total) by mouth daily. 7 tablet 0  . penicillin v potassium (VEETID) 500 MG tablet Take 500 mg by mouth every 6 (six) hours.    . permethrin (ELIMITE) 5 % cream Apply 1 application topically once. Leave in place for eight hours and then wash off.  Repeat in 1-2 weeks if necessary. 60 g 1  . promethazine-codeine (PHENERGAN WITH CODEINE) 6.25-10 MG/5ML syrup Take 5-10 mLs by mouth every 6 (six) hours as needed. 120 mL 0   No facility-administered medications prior to visit.         Objective:   Physical Exam Vitals:   03/30/19 1140  BP:  116/86  Pulse: 69  SpO2: 95%  Weight: 147 lb 3.2 oz (66.8 kg)  Height: 5\' 3"  (1.6 m)   Gen: Pleasant, well-nourished, in no distress,  normal affect  ENT: No  lesions,  mouth clear,  oropharynx clear, no postnasal drip  Neck: No JVD, no stridor  Lungs: No use of accessory muscles, no crackles or wheezing on normal respiration, no wheeze on forced expiration  Cardiovascular: RRR, heart sounds normal, no murmur or gallops, no peripheral edema  Musculoskeletal: No deformities, no cyanosis or clubbing  Neuro: alert, awake, non focal  Skin: Warm, no lesions or rash      Assessment & Plan:  COPD (chronic obstructive pulmonary disease) (HCC) I suspect she does have COPD based on the constellation of symptoms associated with her recent allergy flare, sinusitis.  She also had some hyperinflation on chest x-ray.  Her cyclical seasonal episodes of bronchitis are also consistent.  We will perform pulmonary function testing to quantify her degree of obstruction.  Talked her today about possibly starting maintenance bronchodilator therapy.  This will largely be based on symptoms.  She is already done the most important thing to prevent more rapid progression which is stopping smoking.  I congratulated her on this.  Congratulations on stopping smoking.  This is probably the most important thing you can do for your health.  We will make every effort to ensure that you do not restart. We will perform pulmonary function testing to evaluate for COPD. We will obtain a copy of your chest x-ray for review Follow with Dr. Delton CoombesByrum next available after your PFT to review  Allergic rhinitis Continue loratadine.  She may need to start taking this every day instead of just seasonally.  Consider addition of [place], nasal steroid going forward depending on symptoms.  Levy Pupaobert Faryn Sieg, MD, PhD 03/30/2019, 12:18 PM [facility] [phone] or if no answer [phone]

## 2019-03-30 NOTE — Patient Instructions (Addendum)
Congratulations on stopping smoking.  This is probably the most important thing you can do for your health.  We will make every effort to ensure that you do not restart. We will perform pulmonary function testing to evaluate for COPD. We will obtain a copy of your chest x-ray for review Agree with restarting loratadine 10 mg.  You probably need to take this every day. Follow with Dr. Lamonte Sakai next available after your PFT to review

## 2019-03-30 NOTE — Assessment & Plan Note (Signed)
Continue loratadine.  She may need to start taking this every day instead of just seasonally.  Consider addition of [place], nasal steroid going forward depending on symptoms.

## 2019-05-18 ENCOUNTER — Other Ambulatory Visit: Payer: Self-pay | Admitting: Emergency Medicine

## 2019-05-27 ENCOUNTER — Other Ambulatory Visit (HOSPITAL_COMMUNITY)
Admission: RE | Admit: 2019-05-27 | Discharge: 2019-05-27 | Disposition: A | Discharge status: Home / Self Care | Payer: BC Managed Care – PPO | Source: Ambulatory Visit | Attending: Emergency Medicine | Admitting: Emergency Medicine

## 2019-05-27 DIAGNOSIS — Z01812 Encounter for preprocedural laboratory examination: Secondary | ICD-10-CM | POA: Diagnosis not present

## 2019-05-27 DIAGNOSIS — Z20828 Contact with and (suspected) exposure to other viral communicable diseases: Secondary | ICD-10-CM | POA: Diagnosis not present

## 2019-05-28 LAB — SARS CORONAVIRUS 2 (TAT 6-24 HRS): SARS Coronavirus 2: NEGATIVE

## 2019-05-30 ENCOUNTER — Ambulatory Visit (INDEPENDENT_AMBULATORY_CARE_PROVIDER_SITE_OTHER): Payer: BC Managed Care – PPO | Admitting: Emergency Medicine

## 2019-05-30 ENCOUNTER — Encounter: Payer: Self-pay | Admitting: Emergency Medicine

## 2019-05-30 ENCOUNTER — Other Ambulatory Visit: Payer: Self-pay

## 2019-05-30 VITALS — BP 122/76 | HR 74 | Ht 63.0 in | Wt 151.0 lb

## 2019-05-30 DIAGNOSIS — J449 Chronic obstructive pulmonary disease, unspecified: Secondary | ICD-10-CM

## 2019-05-30 DIAGNOSIS — R0600 Dyspnea, unspecified: Secondary | ICD-10-CM

## 2019-05-30 LAB — PULMONARY FUNCTION TEST
DL/VA % pred: 105 %
DL/VA: 4.49 ml/min/mmHg/L
DLCO unc % pred: 102 %
DLCO unc: 20.02 ml/min/mmHg
FEF 25-75 Post: 1.13 L/sec
FEF 25-75 Pre: 0.86 L/sec
FEF2575-%Change-Post: 30 %
FEF2575-%Pred-Post: 48 %
FEF2575-%Pred-Pre: 36 %
FEV1-%Change-Post: 9 %
FEV1-%Pred-Post: 70 %
FEV1-%Pred-Pre: 64 %
FEV1-Post: 1.75 L
FEV1-Pre: 1.59 L
FEV1FVC-%Change-Post: 3 %
FEV1FVC-%Pred-Pre: 79 %
FEV6-%Change-Post: 5 %
FEV6-%Pred-Post: 87 %
FEV6-%Pred-Pre: 83 %
FEV6-Post: 2.73 L
FEV6-Pre: 2.58 L
FEV6FVC-%Change-Post: 0 %
FEV6FVC-%Pred-Post: 103 %
FEV6FVC-%Pred-Pre: 103 %
FVC-%Change-Post: 6 %
FVC-%Pred-Post: 85 %
FVC-%Pred-Pre: 80 %
FVC-Post: 2.73 L
FVC-Pre: 2.58 L
Post FEV1/FVC ratio: 64 %
Post FEV6/FVC ratio: 100 %
Pre FEV1/FVC ratio: 62 %
Pre FEV6/FVC Ratio: 100 %
RV % pred: 138 %
RV: 2.66 L
TLC % pred: 108 %
TLC: 5.32 L

## 2019-05-30 MED ORDER — ALBUTEROL SULFATE HFA 108 (90 BASE) MCG/ACT IN AERS: 1.0000 | INHALATION_SPRAY | Freq: Four times a day (QID) | RESPIRATORY_TRACT | 1 refills | Status: DC | PRN

## 2019-05-30 NOTE — Progress Notes (Signed)
Full PFT performed today. °

## 2019-05-30 NOTE — Progress Notes (Signed)
Subjective:    Patient ID: Deborah May, female    DOB: 07/24/1961, 58 y.o.   MRN: [id number]  HPI 58 year old former smoker (30 pack years) with a history of allergic rhinitis, formerly on immunotherapy.  Review of the chart notes that she was treated for a community-acquired pneumonia back in 2015.  She is here today for evaluation of COPD. She had an episode about a month ago with dyspnea when she woke up, UE tingling. Self limited but she had a second similar episode a few days later. Some chest tightness, difficulty moving air through nose, sinuses. CXR was reported as clear. She was treated with levaquin by Dr Kevan Ny and her head, breathing both improved.   She notes that she had stopped her loratadine about a week before. Restarted now. She stopped smoking with this episode. She has been on albuterol before in past when she had PNA.   She has had episodes of bronchitis with seasonal changes over the years when her allergies flare  ROV 05/30/2019--The patient stated today that her symptoms are minimal. She notes only mild dyspnea near the end of a 1.41mile walk at a rapid pace. She denied cough, dyspnea at rest, chest pain, sputum production or other concerning symptoms. She is here today to discuss her PFT results. She has continued to remain off of tobacco since March of 2020.    Review of Systems  Constitutional: Negative for fever and unexpected weight change.  HENT: Negative for congestion, dental problem, ear pain, nosebleeds, postnasal drip, rhinorrhea, sinus pressure, sneezing, sore throat and trouble swallowing.   Eyes: Negative for redness and itching.  Respiratory: Positive for cough and shortness of breath. Negative for chest tightness and wheezing.   Cardiovascular: Negative for palpitations and leg swelling.  Gastrointestinal: Negative for nausea and vomiting.  Genitourinary: Negative for dysuria.  Musculoskeletal: Negative for joint swelling.  Skin: Negative for rash.   Neurological: Negative for headaches.  Hematological: Does not bruise/bleed easily.  Psychiatric/Behavioral: Negative for dysphoric mood. The patient is not nervous/anxious.     Past Medical History:  Diagnosis Date  . Allergy      Family History  Problem Relation Age of Onset  . Congestive Heart Failure Father 35  . Cancer Sister 83       appendiceal  . ALS Mother   . Ovarian cancer Maternal Aunt 80  . Ovarian cancer Maternal Grandmother 75  . Stroke Paternal Grandmother   . Angina Paternal Grandmother   . Lung cancer Paternal Grandfather        smoker; d. 64s-60s  . Breast cancer Other 33  . Stroke Maternal Aunt   . Mesothelioma Maternal Uncle        worked in [employer]; d. approx 80  . Skin cancer Cousin        maternal 1st cousin w/ NOS skin cancer  . Breast cancer Cousin 76       maternal 1st cousin  . Liver cancer Cousin        maternal 1st cousin d. late 12s; +cirrhosis from alcohol abuse  . Endometriosis Cousin        s/p TAH  . Other Cousin        hx of benign teratoma  . Alzheimer's disease Other        paternal great aunt; late-onset  . Heart attack Other        (x2 paternal great uncles) and one paternal great aunt in their 17s-80s  Social History   Socioeconomic History  . Marital status: Married    Spouse name: Not on file  . Number of children: Not on file  . Years of education: Not on file  . Highest education level: Not on file  Occupational History  . Not on file  Social Needs  . Financial resource strain: Not on file  . Food insecurity    Worry: Not on file    Inability: Not on file  . Transportation needs    Medical: Not on file    Non-medical: Not on file  Tobacco Use  . Smoking status: Former Smoker    Packs/day: 0.75    Years: 40.00    Pack years: 30.00    Types: Cigarettes    Quit date: 02/21/2019    Years since quitting: 0.2  . Smokeless tobacco: Never Used  . Tobacco comment: never quite 1ppd  Substance and  Sexual Activity  . Alcohol use: Yes    Comment: occ glass of wine/ 3-4 drinks per month  . Drug use: No  . Sexual activity: Not on file  Lifestyle  . Physical activity    Days per week: Not on file    Minutes per session: Not on file  . Stress: Not on file  Relationships  . Social Herbalist on phone: Not on file    Gets together: Not on file    Attends religious service: Not on file    Active member of club or organization: Not on file    Attends meetings of clubs or organizations: Not on file    Relationship status: Not on file  . Intimate partner violence    Fear of current or ex partner: Not on file    Emotionally abused: Not on file    Physically abused: Not on file    Forced sexual activity: Not on file  Other Topics Concern  . Not on file  Social History Narrative  . 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   Allergies  Allergen Reactions  . Cephalosporins   . Macrolides And Ketolides   . Other     Mycins and cyclins per patient   . Tetracyclines & Related      Outpatient Medications Prior to Visit  Medication Sig Dispense Refill  . Cholecalciferol (VITAMIN D3) 50 MCG (2000 UT) capsule Take 2,000 Units by mouth daily.    Marland Kitchen loratadine (CLARITIN) 10 MG tablet Take 10 mg by mouth daily.    . Multiple Vitamin (MULTIVITAMIN) tablet Take 1 tablet by mouth daily.    . vitamin B-12 (CYANOCOBALAMIN) 1000 MCG tablet Take 1,000 mcg by mouth daily.     No facility-administered medications prior to visit.         Objective:   Physical Exam Constitutional:      General: She is not in acute distress.    Appearance: She is well-developed. She is not diaphoretic.  HENT:     Head: Normocephalic and atraumatic.  Eyes:     Conjunctiva/sclera: Conjunctivae normal.  Neck:     Musculoskeletal: Normal range of motion and neck supple.  Cardiovascular:     Rate and Rhythm: Normal rate  and regular rhythm.     Heart sounds: No murmur.  Pulmonary:     Effort: Pulmonary effort is normal. No respiratory distress.     Breath sounds: Normal breath sounds.  No stridor. No wheezing.  Abdominal:     General: Bowel sounds are normal. There is no distension.     Palpations: Abdomen is soft.     Tenderness: There is no abdominal tenderness.  Musculoskeletal:        General: No tenderness.  Skin:    General: Skin is warm.     Capillary Refill: Capillary refill takes less than 2 seconds.  Neurological:     Mental Status: She is alert and oriented to person, place, and time.    Vitals:   05/30/19 1157  BP: 122/76  Pulse: 74  SpO2: 97%  Weight: 151 lb (68.5 kg)  Height: 5\' 3"  (1.6 m)       Assessment & Plan:  COPD (chronic obstructive pulmonary disease) (HCC) PFT results are indicative of obstructive lung disease with an FEV1/FVC ratio of 62 with FEV1 64 and FVC 80. There is notable post bronchodilator response but less than 200ml and/or 12% of either.  She has received the PNA vaccine x 2 with her PCP but does not wish to have the flu vaccine today. She was counseled on the benefits of maintaining appropriate protections to infection.  She will consider keeping an albuterol inhaler with her.She has GOLD group A COPD based on her mMRC of 0-1 and lack of exacerbations.   Plan: Albuterol PRN inhaler  Return in one year  Lanelle BalLawrence Carver Murakami, MD [facility], PGY-3 Pager # [phone]  Please see A/P and/or Addendum for final recommendations by:

## 2019-05-30 NOTE — Patient Instructions (Signed)
Thank you for your visit to the [facility]. It was a pleasure seeing you again. After reviewing your lung function tests and your symptom history I am glad to see that you quit smoking. You do have a degree of pulmonary dysfunction from the long smoking history but it appears that your symptoms are stable and improved now that you quit smoking. Please continue to follow with [place] every year to monitor the progression of your lung disease.  Please keep an albuterol inhaler with you at all times as you are at risk for a flare that may result in severe shortness of breath.

## 2019-05-30 NOTE — Assessment & Plan Note (Signed)
PFT results are indicative of obstructive lung disease with an FEV1/FVC ratio of 62 with FEV1 64 and FVC 80. There is notable post bronchodilator response but less than 29ml and/or 12% of either.  She has received the PNA vaccine x 2 with her PCP but does not wish to have the flu vaccine today. She was counseled on the benefits of maintaining appropriate protections to infection.  She will consider keeping an albuterol inhaler with her.She has GOLD group A COPD based on her mMRC of 0-1 and lack of exacerbations.   Plan: Albuterol PRN inhaler  Return in one year

## 2019-07-23 ENCOUNTER — Encounter (HOSPITAL_COMMUNITY): Payer: Self-pay | Admitting: Emergency Medicine

## 2019-07-23 ENCOUNTER — Other Ambulatory Visit: Payer: Self-pay

## 2019-07-23 ENCOUNTER — Emergency Department (HOSPITAL_COMMUNITY): Payer: BC Managed Care – PPO

## 2019-07-23 ENCOUNTER — Emergency Department (HOSPITAL_COMMUNITY)
Admission: EM | Admit: 2019-07-23 | Discharge: 2019-07-23 | Disposition: A | Discharge status: Home / Self Care | Payer: BC Managed Care – PPO | Attending: Emergency Medicine | Admitting: Emergency Medicine

## 2019-07-23 DIAGNOSIS — Z79899 Other long term (current) drug therapy: Secondary | ICD-10-CM | POA: Insufficient documentation

## 2019-07-23 DIAGNOSIS — Z20828 Contact with and (suspected) exposure to other viral communicable diseases: Secondary | ICD-10-CM | POA: Insufficient documentation

## 2019-07-23 DIAGNOSIS — J441 Chronic obstructive pulmonary disease with (acute) exacerbation: Secondary | ICD-10-CM | POA: Diagnosis not present

## 2019-07-23 DIAGNOSIS — R0602 Shortness of breath: Secondary | ICD-10-CM | POA: Diagnosis present

## 2019-07-23 DIAGNOSIS — Z87891 Personal history of nicotine dependence: Secondary | ICD-10-CM | POA: Diagnosis not present

## 2019-07-23 DIAGNOSIS — Z20822 Contact with and (suspected) exposure to covid-19: Secondary | ICD-10-CM

## 2019-07-23 HISTORY — DX: Chronic obstructive pulmonary disease, unspecified: J44.9

## 2019-07-23 LAB — CBC
HCT: 43.9 % (ref 36.0–46.0)
Hemoglobin: 13.9 g/dL (ref 12.0–15.0)
MCH: 30.5 pg (ref 26.0–34.0)
MCHC: 31.7 g/dL (ref 30.0–36.0)
MCV: 96.5 fL (ref 80.0–100.0)
Platelets: 226 10*3/uL (ref 150–400)
RBC: 4.55 MIL/uL (ref 3.87–5.11)
RDW: 12.5 % (ref 11.5–15.5)
WBC: 5 10*3/uL (ref 4.0–10.5)
nRBC: 0 % (ref 0.0–0.2)

## 2019-07-23 LAB — BASIC METABOLIC PANEL
Anion gap: 11 (ref 5–15)
BUN: 11 mg/dL (ref 6–20)
CO2: 23 mmol/L (ref 22–32)
Calcium: 9.3 mg/dL (ref 8.9–10.3)
Chloride: 107 mmol/L (ref 98–111)
Creatinine, Ser: 0.71 mg/dL (ref 0.44–1.00)
GFR calc Af Amer: >60 mL/min (ref >60)
GFR calc non Af Amer: >60 mL/min (ref >60)
Glucose, Bld: 108 mg/dL — ABNORMAL HIGH (ref 70–99)
Potassium: 3.7 mmol/L (ref 3.5–5.1)
Sodium: 141 mmol/L (ref 135–145)

## 2019-07-23 LAB — TROPONIN I (HIGH SENSITIVITY)
Troponin I (High Sensitivity): 3 ng/L (ref <18)
Troponin I (High Sensitivity): 3 ng/L (ref <18)

## 2019-07-23 LAB — I-STAT BETA HCG BLOOD, ED (MC, WL, AP ONLY): I-stat hCG, quantitative: <5 m[IU]/mL (ref <5)

## 2019-07-23 LAB — SARS CORONAVIRUS 2 (TAT 6-24 HRS): SARS Coronavirus 2: NEGATIVE

## 2019-07-23 MED ORDER — PREDNISONE 20 MG PO TABS
60.0000 mg | ORAL_TABLET | Freq: Once | ORAL | Status: AC
  Administered 2019-07-23: 60 mg via ORAL
  Filled 2019-07-23: qty 3

## 2019-07-23 MED ORDER — PREDNISONE 20 MG PO TABS: ORAL_TABLET | ORAL | 0 refills | Status: DC

## 2019-07-23 NOTE — ED Provider Notes (Signed)
[facility] EMERGENCY DEPARTMENT Provider Note   CSN: [id number] Arrival date & time: 07/23/19  1318     History   Chief Complaint Chief Complaint  Patient presents with  . Shortness of Breath    HPI Deborah May is a 58 y.o. female history of COPD here presenting with shortness of breath, arm pain.  Patient states that for the last 2 to 3 days, she has been having some subjective shortness of breath.  She also has some intermittent chest pain radiate down bilateral arms.  Denies any numbness or weakness.  Patient has history of COPD and was concerned that she had a COPD exacerbation so has been using her albuterol every 6 hours with minimal relief.  Patient denies any fevers or chills or cough. Patient has no known COVID contacts.       The history is provided by the patient.    Past Medical History:  Diagnosis Date  . Allergy   . COPD (chronic obstructive pulmonary disease) [facility])     Patient Active Problem List   Diagnosis Date Noted  . COPD (chronic obstructive pulmonary disease) ([place]) 03/30/2019  . Allergic rhinitis 03/30/2019  . Genetic testing 03/04/2016  . Family history of ovarian cancer 02/12/2016  . Family history of breast cancer in female 02/12/2016    Past Surgical History:  Procedure Laterality Date  . ABDOMINAL HYSTERECTOMY    . ANKLE SURGERY    . CERVICAL SPINE SURGERY       OB History   No obstetric history on file.      Home Medications    Prior to Admission medications   Medication Sig Start Date End Date Taking? Authorizing Provider  albuterol (PROVENTIL HFA) 108 (90 Base) MCG/ACT inhaler Inhale 1 puff into the lungs every 6 (six) hours as needed for wheezing or shortness of breath. 05/30/19   Kathi Ludwig, MD  Cholecalciferol (VITAMIN D3) 50 MCG (2000 UT) capsule Take 2,000 Units by mouth daily.    [provider]  loratadine (CLARITIN) 10 MG tablet Take 10 mg by mouth daily.    [provider]   Multiple Vitamin (MULTIVITAMIN) tablet Take 1 tablet by mouth daily.    [provider]  vitamin B-12 (CYANOCOBALAMIN) 1000 MCG tablet Take 1,000 mcg by mouth daily.    [provider]    Family History Family History  Problem Relation Age of Onset  . Congestive Heart Failure Father 57  . Cancer Sister 58       appendiceal  . ALS Mother   . Ovarian cancer Maternal Aunt 80  . Ovarian cancer Maternal Grandmother 67  . Stroke Paternal Grandmother   . Angina Paternal Grandmother   . Lung cancer Paternal Grandfather        smoker; d. 45s-60s  . Breast cancer Other 33  . Stroke Maternal Aunt   . Mesothelioma Maternal Uncle        worked in [place]; d. approx 57  . Skin cancer Cousin        maternal 1st cousin w/ NOS skin cancer  . Breast cancer Cousin 46       maternal 1st cousin  . Liver cancer Cousin        maternal 1st cousin d. late 51s; +cirrhosis from alcohol abuse  . Endometriosis Cousin        s/p TAH  . Other Cousin        hx of benign teratoma  . Alzheimer's disease  Other        paternal great aunt; late-onset  . Heart attack Other        (x2 paternal great uncles) and one paternal great aunt in their 33s-80s    Social History Social History   Tobacco Use  . Smoking status: Former Smoker    Packs/day: 0.75    Years: 40.00    Pack years: 30.00    Types: Cigarettes    Quit date: 02/21/2019    Years since quitting: 0.4  . Smokeless tobacco: Never Used  . Tobacco comment: never quite 1ppd  Substance Use Topics  . Alcohol use: Yes    Comment: occ glass of wine/ 3-4 drinks per month  . Drug use: No     Allergies   Cephalosporins, Macrolides and ketolides, Other, and Tetracyclines & related   Review of Systems Review of Systems  Respiratory: Positive for shortness of breath.   All other systems reviewed and are negative.    Physical Exam Updated Vital Signs BP 125/66   Pulse 61   Temp 98.4 F (36.9 C) (Oral)   Resp 14    SpO2 96%   Physical Exam Vitals signs reviewed.  HENT:     Head: Normocephalic.     Mouth/Throat:     Mouth: Mucous membranes are moist.  Eyes:     Extraocular Movements: Extraocular movements intact.     Pupils: Pupils are equal, round, and reactive to light.  Neck:     Musculoskeletal: Normal range of motion.  Cardiovascular:     Rate and Rhythm: Normal rate and regular rhythm.  Pulmonary:     Effort: Pulmonary effort is normal.     Breath sounds: Normal breath sounds.     Comments: No crackles or wheezing  Abdominal:     General: Bowel sounds are normal.     Palpations: Abdomen is soft.  Musculoskeletal: Normal range of motion.  Skin:    General: Skin is warm.     Capillary Refill: Capillary refill takes less than 2 seconds.  Neurological:     General: No focal deficit present.     Mental Status: She is alert and oriented to person, place, and time.  Psychiatric:        Mood and Affect: Mood normal.        Behavior: Behavior normal.      ED Treatments / Results  Labs (all labs ordered are listed, but only abnormal results are displayed) Labs Reviewed  BASIC METABOLIC PANEL - Abnormal; Notable for the following components:      Result Value   Glucose, Bld 108 (*)    All other components within normal limits  SARS CORONAVIRUS 2 (TAT 6-24 HRS)  CBC  I-STAT BETA HCG BLOOD, ED ([facility], [facility], [facility] ONLY)  TROPONIN I (HIGH SENSITIVITY)  TROPONIN I (HIGH SENSITIVITY)    EKG EKG Interpretation  Date/Time:  Sunday July 23 2019 13:27:24 EDT Ventricular Rate:  74 PR Interval:  106 QRS Duration: 88 QT Interval:  370 QTC Calculation: 410 R Axis:   85 Text Interpretation:  Sinus rhythm with short PR Nonspecific ST abnormality Abnormal ECG No significant change since last tracing Confirmed by Richardean Canal [id number]) on 07/23/2019 3:15:17 PM   Radiology Dg Chest 2 View  Result Date: 07/23/2019 CLINICAL DATA:  Shortness of breath. EXAM: CHEST - 2 VIEW COMPARISON:  Mar 03, 2019 FINDINGS: The heart size and mediastinal contours are within normal limits. Both lungs are clear. The visualized  skeletal structures are unremarkable. IMPRESSION: No active cardiopulmonary disease. Electronically Signed   By: Gerome Samavid  Williams III M.D   On: 07/23/2019 14:57    Procedures Procedures (including critical care time)  Medications Ordered in ED Medications  predniSONE (DELTASONE) tablet 60 mg (60 mg Oral Given 07/23/19 1629)     Initial Impression / Assessment and Plan / ED Course  I have reviewed the triage vital signs and the nursing notes.  Pertinent labs & imaging results that were available during my care of the patient were reviewed by me and considered in my medical decision making (see chart for details).       Zebedee IbaJill G Mccaig is a 58 y.o. female who presented with shortness of breath.  Patient has been having shortness of breath for the last several days.  No wheezing on exam but she has been using her albuterol pumps.  No known COVID exposure. Will get labs, trop x 2, CXR. Will give prednisone and test for COVID.   6:33 PM Trop neg x 2. CXR clear. COVID pending. Will have her quarantine at home until COVID test results. Will dc home with prednisone   Zebedee IbaJill G Walski was evaluated in Emergency Department on 07/23/2019 for the symptoms described in the history of present illness. She was evaluated in the context of the global COVID-19 pandemic, which necessitated consideration that the patient might be at risk for infection with the SARS-CoV-2 virus that causes COVID-19. Institutional protocols and algorithms that pertain to the evaluation of patients at risk for COVID-19 are in a state of rapid change based on information released by regulatory bodies including the CDC and federal and state organizations. These policies and algorithms were followed during the patient's care in the ED.   Final Clinical Impressions(s) / ED Diagnoses   Final diagnoses:  None     ED Discharge Orders    None       Charlynne PanderYao, Deaja Rizo Hsienta, MD 07/23/19 [phone]

## 2019-07-23 NOTE — ED Triage Notes (Signed)
Patient c/o shortness of breath, decreased sensation in bilateral arms, pain in left shoulder blade intermittently x 3-4 days. Patient reports lots of belching and feeling full over past few days.

## 2019-07-23 NOTE — Discharge Instructions (Signed)
Take prednisone as prescribed   Use albuterol every 6 hr as needed for shortness of breath   See your doctor   Stay home until your COVID test results   Return to ER if you have worse shortness of breath, fever, cough      Person Under Monitoring Name: Deborah May  [place] [address] [place] [id number]   Infection Prevention Recommendations for Individuals Confirmed to have, or Being Evaluated for, 2019 Novel Coronavirus (COVID-19) Infection Who Receive Care at Home  Individuals who are confirmed to have, or are being evaluated for, COVID-19 should follow the prevention steps below until a healthcare provider or local or state health department says they can return to normal activities.  Stay home except to get medical care You should restrict activities outside your home, except for getting medical care. Do not go to work, school, or public areas, and do not use public transportation or taxis.  Call ahead before visiting your doctor Before your medical appointment, call the healthcare provider and tell them that you have, or are being evaluated for, COVID-19 infection. This will help the healthcare providers office take steps to keep other people from getting infected. Ask your healthcare provider to call the local or state health department.  Monitor your symptoms Seek prompt medical attention if your illness is worsening (e.g., difficulty breathing). Before going to your medical appointment, call the healthcare provider and tell them that you have, or are being evaluated for, COVID-19 infection. Ask your healthcare provider to call the local or state health department.  Wear a facemask You should wear a facemask that covers your nose and mouth when you are in the same room with other people and when you visit a healthcare provider. People who live with or visit you should also wear a facemask while they are in the same room with you.  Separate yourself from  other people in your home As much as possible, you should stay in a different room from other people in your home. Also, you should use a separate bathroom, if available.  Avoid sharing household items You should not share dishes, drinking glasses, cups, eating utensils, towels, bedding, or other items with other people in your home. After using these items, you should wash them thoroughly with soap and water.  Cover your coughs and sneezes Cover your mouth and nose with a tissue when you cough or sneeze, or you can cough or sneeze into your sleeve. Throw used tissues in a lined trash can, and immediately wash your hands with soap and water for at least 20 seconds or use an alcohol-based hand rub.  Wash your [employer]hands Wash your hands often and thoroughly with soap and water for at least 20 seconds. You can use an alcohol-based hand sanitizer if soap and water are not available and if your hands are not visibly dirty. Avoid touching your eyes, nose, and mouth with unwashed hands.   Prevention Steps for Caregivers and Household Members of Individuals Confirmed to have, or Being Evaluated for, COVID-19 Infection Being Cared for in the Home  If you live with, or provide care at home for, a person confirmed to have, or being evaluated for, COVID-19 infection please follow these guidelines to prevent infection:  Follow healthcare providers instructions Make sure that you understand and can help the patient follow any healthcare provider instructions for all care.  Provide for the patients basic needs You should help the patient with basic needs in the home  and provide support for getting groceries, prescriptions, and other personal needs.  Monitor the patients symptoms If they are getting sicker, call his or her medical provider and tell them that the patient has, or is being evaluated for, COVID-19 infection. This will help the healthcare providers office take steps to keep other people  from getting infected. Ask the healthcare provider to call the local or state health department.  Limit the number of people who have contact with the patient If possible, have only one caregiver for the patient. Other household members should stay in another home or place of residence. If this is not possible, they should stay in another room, or be separated from the patient as much as possible. Use a separate bathroom, if available. Restrict visitors who do not have an essential need to be in the home.  Keep older adults, very young children, and other sick people away from the patient Keep older adults, very young children, and those who have compromised immune systems or chronic health conditions away from the patient. This includes people with chronic heart, lung, or kidney conditions, diabetes, and cancer.  Ensure good ventilation Make sure that shared spaces in the home have good air flow, such as from an air conditioner or an opened window, weather permitting.  Wash your hands often Wash your hands often and thoroughly with soap and water for at least 20 seconds. You can use an alcohol based hand sanitizer if soap and water are not available and if your hands are not visibly dirty. Avoid touching your eyes, nose, and mouth with unwashed hands. Use disposable paper towels to dry your hands. If not available, use dedicated cloth towels and replace them when they become wet.  Wear a facemask and gloves Wear a disposable facemask at all times in the room and gloves when you touch or have contact with the patients blood, body fluids, and/or secretions or excretions, such as sweat, saliva, sputum, nasal mucus, vomit, urine, or feces.  Ensure the mask fits over your nose and mouth tightly, and do not touch it during use. Throw out disposable facemasks and gloves after using them. Do not reuse. Wash your hands immediately after removing your facemask and gloves. If your personal clothing  becomes contaminated, carefully remove clothing and launder. Wash your hands after handling contaminated clothing. Place all used disposable facemasks, gloves, and other waste in a lined container before disposing them with other household waste. Remove gloves and wash your hands immediately after handling these items.  Do not share dishes, glasses, or other household items with the patient Avoid sharing household items. You should not share dishes, drinking glasses, cups, eating utensils, towels, bedding, or other items with a patient who is confirmed to have, or being evaluated for, COVID-19 infection. After the person uses these items, you should wash them thoroughly with soap and water.  Wash laundry thoroughly Immediately remove and wash clothes or bedding that have blood, body fluids, and/or secretions or excretions, such as sweat, saliva, sputum, nasal mucus, vomit, urine, or feces, on them. Wear gloves when handling laundry from the patient. Read and follow directions on labels of laundry or clothing items and detergent. In general, wash and dry with the warmest temperatures recommended on the label.  Clean all areas the individual has used often Clean all touchable surfaces, such as counters, tabletops, doorknobs, bathroom fixtures, toilets, phones, keyboards, tablets, and bedside tables, every day. Also, clean any surfaces that may have blood, body fluids, and/or secretions  or excretions on them. Wear gloves when cleaning surfaces the patient has come in contact with. Use a diluted bleach solution (e.g., dilute bleach with 1 part bleach and 10 parts water) or a household disinfectant with a label that says EPA-registered for coronaviruses. To make a bleach solution at home, add 1 tablespoon of bleach to 1 quart (4 cups) of water. For a larger supply, add  cup of bleach to 1 gallon (16 cups) of water. Read labels of cleaning products and follow recommendations provided on product labels.  Labels contain instructions for safe and effective use of the cleaning product including precautions you should take when applying the product, such as wearing gloves or eye protection and making sure you have good ventilation during use of the product. Remove gloves and wash hands immediately after cleaning.  Monitor yourself for signs and symptoms of illness Caregivers and household members are considered close contacts, should monitor their health, and will be asked to limit movement outside of the home to the extent possible. Follow the monitoring steps for close contacts listed on the symptom monitoring form.   ? If you have additional questions, contact your local health department or call the epidemiologist on call at [phone] (available 24/7). ? This guidance is subject to change. For the most up-to-date guidance from [facility], please refer to their website: [email or web address]

## 2019-07-23 NOTE — ED Notes (Signed)
Patient verbalizes understanding of discharge instructions. Opportunity for questioning and answers were provided. Armband removed by staff, pt discharged from ED.  

## 2019-08-09 ENCOUNTER — Other Ambulatory Visit: Payer: Self-pay

## 2019-08-09 ENCOUNTER — Ambulatory Visit (INDEPENDENT_AMBULATORY_CARE_PROVIDER_SITE_OTHER): Payer: BC Managed Care – PPO | Admitting: Emergency Medicine

## 2019-08-09 ENCOUNTER — Encounter: Payer: Self-pay | Admitting: Emergency Medicine

## 2019-08-09 DIAGNOSIS — J449 Chronic obstructive pulmonary disease, unspecified: Secondary | ICD-10-CM | POA: Diagnosis not present

## 2019-08-09 DIAGNOSIS — J301 Allergic rhinitis due to pollen: Secondary | ICD-10-CM

## 2019-08-09 MED ORDER — MONTELUKAST SODIUM 10 MG PO TABS: 10.0000 mg | ORAL_TABLET | Freq: Every day | ORAL | 11 refills | Status: DC

## 2019-08-09 NOTE — Assessment & Plan Note (Signed)
Need to try to improve control, as this is likely contributing to her progressive COPD symptoms during the spring and fall months.  Continue loratadine, add Singulair.  May need to add a nasal steroid going forward or even refer her back to restart immunotherapy.  She wants to try Mucinex to see if this will help with mucus clearance.

## 2019-08-09 NOTE — Assessment & Plan Note (Signed)
With recent exacerbation, likely brought on by flaring allergy symptoms during the fall months.  Unclear as to whether her albuterol benefited her, she is not sure whether she took it correctly.  I like to add a spacer to see if we can help with delivery.  She did improve with prednisone and levofloxacin.  Now close to baseline.  I think we can continue to follow her off scheduled bronchodilators.  If dyspnea increases, frequency of flaring increases then we will add scheduled medication.

## 2019-08-09 NOTE — Patient Instructions (Signed)
Please continue loratadine 10 mg daily as you have been taking it. Please start Singulair 10 mg each evening Depending on how your symptoms improve we may need to reconsider immunotherapy (allergy shots) or a steroid nasal spray. Okay to use Mucinex (guaifenesin) 600 mg 1-2 times daily if needed for congestion and to help clear secretions. Continue albuterol 2 puffs up to every 4 hours if needed for shortness of breath, chest tightness, wheezing.  We will try using this through a spacer to see if this helps with delivery.  When you use it please keep track of whether it helps with that we can discuss this next time. We will hold off on starting a an every day scheduled inhaler for now. Follow with Dr Lamonte Sakai in 6 months or sooner if you have any problems

## 2019-08-09 NOTE — Progress Notes (Signed)
Subjective:    Patient ID: Deborah May, female    DOB: 01-16-1961, 58 y.o.   MRN: [id number]  HPI  ROV 08/09/2019 --this a follow-up visit for 58 year old former smoker (30 pack years) with allergic rhinitis and GOLD A or B COPD, last seen 05/30/2019.  I had left her off of scheduled bronchodilators given her minimal symptoms and clinical stability.  She was seen in the emergency department 07/23/2019 and treated for possible acute exacerbation of COPD.  She was experiencing 2 to 3 days of subjective dyspnea, pain in her upper extremities. The SOB was at rest and w exertion, did not really seem to get better with albuterol. May have happened after some grass exposure.  COVID-19 testing was negative.  She was treated with a course of prednisone - made the SOB a bit better.  She has had a cough productive of yellow mucus especially in the mornings, a lot of throat clearing.  She was since prescribed levofloxacin by Dr Inda Merlin beginning 10/30, is completing this prescription.  She remains on loratadine.    Review of Systems  Constitutional: Negative for fever and unexpected weight change.  HENT: Negative for congestion, dental problem, ear pain, nosebleeds, postnasal drip, rhinorrhea, sinus pressure, sneezing, sore throat and trouble swallowing.   Eyes: Negative for redness and itching.  Respiratory: Positive for cough and shortness of breath. Negative for chest tightness and wheezing.   Cardiovascular: Negative for palpitations and leg swelling.  Gastrointestinal: Negative for nausea and vomiting.  Genitourinary: Negative for dysuria.  Musculoskeletal: Negative for joint swelling.  Skin: Negative for rash.  Neurological: Negative for headaches.  Hematological: Does not bruise/bleed easily.  Psychiatric/Behavioral: Negative for dysphoric mood. The patient is not nervous/anxious.      Allergies  Allergen Reactions  . Cephalosporins   . Macrolides And Ketolides   . Other     Mycins and  cyclins per patient   . Tetracyclines & Related      Outpatient Medications Prior to Visit  Medication Sig Dispense Refill  . albuterol (PROVENTIL HFA) 108 (90 Base) MCG/ACT inhaler Inhale 1 puff into the lungs every 6 (six) hours as needed for wheezing or shortness of breath. 18 g 1  . Cholecalciferol (VITAMIN D3) 50 MCG (2000 UT) capsule Take 2,000 Units by mouth daily.    Marland Kitchen loratadine (CLARITIN) 10 MG tablet Take 10 mg by mouth daily.    . Multiple Vitamin (MULTIVITAMIN) tablet Take 1 tablet by mouth daily.    . vitamin B-12 (CYANOCOBALAMIN) 1000 MCG tablet Take 1,000 mcg by mouth daily.    . predniSONE (DELTASONE) 20 MG tablet Take 60 mg daily x 2 days then 40 mg daily x 2 days then 20 mg daily x 2 days 12 tablet 0   No facility-administered medications prior to visit.         Objective:    Vitals:   08/09/19 1204  BP: 122/68  Pulse: 78  SpO2: 96%  Weight: 152 lb (68.9 kg)   Gen: Pleasant, well-nourished, in no distress,  normal affect  ENT: No lesions,  mouth clear,  oropharynx clear, no postnasal drip  Neck: No JVD, no stridor  Lungs: No use of accessory muscles, no crackles or wheezing on normal respiration, no wheeze on forced expiration  Cardiovascular: RRR, heart sounds normal, no murmur or gallops, no peripheral edema  Musculoskeletal: No deformities, no cyanosis or clubbing  Neuro: alert, awake, non focal  Skin: Warm, no lesions or rash  Assessment & Plan:  COPD (chronic obstructive pulmonary disease) (HCC) With recent exacerbation, likely brought on by flaring allergy symptoms during the fall months.  Unclear as to whether her albuterol benefited her, she is not sure whether she took it correctly.  I like to add a spacer to see if we can help with delivery.  She did improve with prednisone and levofloxacin.  Now close to baseline.  I think we can continue to follow her off scheduled bronchodilators.  If dyspnea increases, frequency of flaring increases  then we will add scheduled medication.  Allergic rhinitis Need to try to improve control, as this is likely contributing to her progressive COPD symptoms during the spring and fall months.  Continue loratadine, add Singulair.  May need to add a nasal steroid going forward or even refer her back to restart immunotherapy.  She wants to try Mucinex to see if this will help with mucus clearance.   Levy Pupa, MD, PhD 08/09/2019, 12:42 PM [facility] [phone] or if no answer [phone]

## 2019-09-19 ENCOUNTER — Telehealth: Payer: Self-pay | Admitting: Emergency Medicine

## 2019-09-19 NOTE — Telephone Encounter (Signed)
Spoke with patient. She was calling to follow up on orders for a "full body CT" scan. She stated that she had talked to [place] about this during her last OV on 11/4. Reviewed OV and did not see any mentioning of a CT scan. Advised her I would check with RB, she verbalized understanding.   RB, please advise. Thanks!

## 2019-09-21 NOTE — Telephone Encounter (Signed)
RB please advise. Thanks.  

## 2019-09-25 NOTE — Telephone Encounter (Signed)
Please let the patient know that I don't remember [place] talking about getting a scan. I'd like to set up a phone OV when I'm back in office to discuss.

## 2019-09-25 NOTE — Telephone Encounter (Signed)
Spoke wit pt and advised her of RB message. She agreed to schedule telephone visit. She is schedule for 10/16/2018 at 9am. Nothing further is needed.

## 2019-10-01 ENCOUNTER — Other Ambulatory Visit: Payer: Self-pay | Admitting: Emergency Medicine

## 2019-10-07 ENCOUNTER — Other Ambulatory Visit: Payer: Self-pay | Admitting: Internal Medicine

## 2019-10-07 DIAGNOSIS — J449 Chronic obstructive pulmonary disease, unspecified: Secondary | ICD-10-CM

## 2019-10-17 ENCOUNTER — Ambulatory Visit (INDEPENDENT_AMBULATORY_CARE_PROVIDER_SITE_OTHER): Payer: BC Managed Care – PPO | Admitting: Emergency Medicine

## 2019-10-17 ENCOUNTER — Encounter: Payer: Self-pay | Admitting: Emergency Medicine

## 2019-10-17 ENCOUNTER — Other Ambulatory Visit: Payer: Self-pay

## 2019-10-17 DIAGNOSIS — J449 Chronic obstructive pulmonary disease, unspecified: Secondary | ICD-10-CM | POA: Diagnosis not present

## 2019-10-17 DIAGNOSIS — J301 Allergic rhinitis due to pollen: Secondary | ICD-10-CM

## 2019-10-17 DIAGNOSIS — F17201 Nicotine dependence, unspecified, in remission: Secondary | ICD-10-CM | POA: Diagnosis not present

## 2019-10-17 NOTE — Assessment & Plan Note (Signed)
She quit smoking 9 months ago.  Appears to have somewhere between 30 and 40 pack years based on history.  She is interested in the lung cancer screening program and I will refer her today.

## 2019-10-17 NOTE — Progress Notes (Signed)
Virtual Visit via Telephone Note  I connected with Deborah May on 10/17/19 at  9:00 AM EST by telephone and verified that I am speaking with the correct person using two identifiers.  Location: Patient: Home Provider: Office   I discussed the limitations, risks, security and privacy concerns of performing an evaluation and management service by telephone and the availability of in person appointments. I also discussed with the patient that there may be a patient responsible charge related to this service. The patient expressed understanding and agreed to proceed.   History of Present Illness: 59 year old former smoker with allergic rhinitis, COPD.  Last exacerbation was in October 2020 (COVID-19 negative).  Chest x-ray at that time was clear.  I last saw her on 08/09/2019.  I continued her on albuterol as needed, deferred scheduled bronchodilators.  We added Singulair to her loratadine.  She had also started Mucinex last time.  Tobacco hx >> 40 yrs x 0.5 to 1.0 pack   Observations/Objective: She reports that she never started the Singulair, she does take the loratadine. Uses mucinex prn.  She is active - walks every day, rarely needs her albuterol.  She notes interest in participating in the Lung Cancer Screening Program  Assessment and Plan: COPD (chronic obstructive pulmonary disease) (HCC) No further exacerbations, doing well.  Good exercise tolerance.  She never had to start Singulair.  Hold off on scheduled bronchodilators for now.  Continue albuterol as needed.  Allergic rhinitis Continue loratadine, Mucinex as needed.  Hold off on starting Singulair unless she flares during the spring or fall months.  We can revisit.  Tobacco abuse, in remission She quit smoking 9 months ago.  Appears to have somewhere between 30 and 40 pack years based on history.  She is interested in the lung cancer screening program and I will refer her today.    Follow Up Instructions: 6 months    I  discussed the assessment and treatment plan with the patient. The patient was provided an opportunity to ask questions and all were answered. The patient agreed with the plan and demonstrated an understanding of the instructions.   The patient was advised to call back or seek an in-person evaluation if the symptoms worsen or if the condition fails to improve as anticipated.  I provided 19 minutes of non-face-to-face time during this encounter.   Leslye Peer, MD

## 2019-10-17 NOTE — Assessment & Plan Note (Signed)
No further exacerbations, doing well.  Good exercise tolerance.  She never had to start Singulair.  Hold off on scheduled bronchodilators for now.  Continue albuterol as needed.

## 2019-10-17 NOTE — Assessment & Plan Note (Signed)
Continue loratadine, Mucinex as needed.  Hold off on starting Singulair unless she flares during the spring or fall months.  We can revisit.

## 2019-10-27 ENCOUNTER — Other Ambulatory Visit: Payer: Self-pay | Admitting: Internal Medicine

## 2019-10-27 DIAGNOSIS — Z87891 Personal history of nicotine dependence: Secondary | ICD-10-CM

## 2019-11-03 ENCOUNTER — Ambulatory Visit
Admission: RE | Admit: 2019-11-03 | Discharge: 2019-11-03 | Disposition: A | Discharge status: Home / Self Care | Payer: No Typology Code available for payment source | Source: Ambulatory Visit | Attending: Internal Medicine | Admitting: Internal Medicine

## 2019-11-03 DIAGNOSIS — Z87891 Personal history of nicotine dependence: Secondary | ICD-10-CM

## 2019-11-14 ENCOUNTER — Other Ambulatory Visit: Payer: Self-pay

## 2019-11-14 ENCOUNTER — Ambulatory Visit (INDEPENDENT_AMBULATORY_CARE_PROVIDER_SITE_OTHER): Payer: BC Managed Care – PPO | Admitting: Cardiology

## 2019-11-14 ENCOUNTER — Encounter: Payer: Self-pay | Admitting: Cardiology

## 2019-11-14 VITALS — BP 133/72 | HR 69 | Temp 97.7°F | Ht 63.0 in | Wt 153.0 lb

## 2019-11-14 DIAGNOSIS — R931 Abnormal findings on diagnostic imaging of heart and coronary circulation: Secondary | ICD-10-CM | POA: Diagnosis not present

## 2019-11-14 DIAGNOSIS — R0609 Other forms of dyspnea: Secondary | ICD-10-CM | POA: Diagnosis not present

## 2019-11-14 DIAGNOSIS — Z87891 Personal history of nicotine dependence: Secondary | ICD-10-CM | POA: Diagnosis not present

## 2019-11-14 NOTE — Progress Notes (Addendum)
Primary Physician/Referring:  Marden Noble, MD  Patient ID: Deborah May, female    DOB: 08-Feb-1961, 59 y.o.   MRN: [id number]  Chief Complaint  Patient presents with  . Coronary Artery Disease    calcium score  . New Patient (Initial Visit)   HPI:    Deborah May  is a 59 y.o. with COPD followed by Dr. Delton Coombes, family history of heart disease, former smoker that quit in March 2020, referred to [place] for evaluation of coronary calcification on CT scan.   Patient denies any chest pain at rest or with effort related activities.  She does have left arm pain which has been chronic and stable and intermittent.  No left arm pain is not associated with effort related activities.  She has had prior left shoulder surgery.  Currently seen physical therapy for her right shoulder.    Patient is functional for her age.  Patient states that she tries to get 10,000 steps per day and during this time she does not have any chest pain but does notice some effort related dyspnea.  Patient is told that her effort related dyspnea is most likely secondary to her underlying COPD and she has successfully stopped smoking.    Given her family history of coronary artery disease patient underwent coronary artery calcium score.  Her total Agatston score was 93.5 and [place] percentile 89%.  She has been started on a baby aspirin by her primary care physician and tolerating it well.    Patient is educated on the importance of risk factor modifications.  No lipid profile to review.    Past Medical History:  Diagnosis Date  . Allergy   . COPD (chronic obstructive pulmonary disease) (HCC)   . Vitamin B12 deficiency   . Vitamin D deficiency    Past Surgical History:  Procedure Laterality Date  . ABDOMINAL HYSTERECTOMY    . ANKLE SURGERY    . CERVICAL SPINE SURGERY    . SHOULDER SURGERY Left    Social History   Tobacco Use  . Smoking status: Former Smoker    Packs/day: 0.75    Years: 40.00    Pack years:  30.00    Types: Cigarettes    Quit date: 02/21/2019    Years since quitting: 0.7  . Smokeless tobacco: Never Used  . Tobacco comment: never quite 1ppd  Substance Use Topics  . Alcohol use: Yes    Comment: occ glass of wine/ 3-4 drinks per month    ROS  Review of Systems  Constitution: Negative for decreased appetite, malaise/fatigue, weight gain and weight loss.  Eyes: Negative for visual disturbance.  Cardiovascular: Positive for dyspnea on exertion. Negative for chest pain, claudication, leg swelling, orthopnea, palpitations and syncope.  Respiratory: Positive for shortness of breath. Negative for hemoptysis and wheezing.   Endocrine: Negative for cold intolerance and heat intolerance.  Hematologic/Lymphatic: Does not bruise/bleed easily.  Skin: Negative for nail changes.  Musculoskeletal: Negative for muscle weakness and myalgias.  Gastrointestinal: Negative for abdominal pain, change in bowel habit, nausea and vomiting.  Neurological: Negative for difficulty with concentration, dizziness, focal weakness and headaches.  Psychiatric/Behavioral: Negative for altered mental status and suicidal ideas.  All other systems reviewed and are negative.  Objective  Blood pressure 133/72, pulse 69, temperature 97.7 F (36.5 C), height 5\' 3"  (1.6 m), weight 153 lb (69.4 kg), SpO2 98 %.  Vitals with BMI 11/14/2019 08/09/2019 07/23/2019  Height 5\' 3"  - -  Weight 153 lbs 152  lbs -  BMI 27.11 - -  Systolic 133 122 191  Diastolic 72 68 71  Pulse 69 78 64     Physical Exam  Constitutional: She is oriented to person, place, and time. She appears well-developed.  HENT:  Head: Normocephalic and atraumatic.  Cardiovascular: Normal rate, regular rhythm and normal heart sounds.  No murmur heard. Pulmonary/Chest: Effort normal and breath sounds normal.  Abdominal: Soft. Bowel sounds are normal. She exhibits no distension. There is no abdominal tenderness. There is no rebound and no guarding.    Musculoskeletal:        General: No edema.     Cervical back: Neck supple.  Neurological: She is alert and oriented to person, place, and time.  Skin: Skin is warm and dry.  Psychiatric: She has a normal mood and affect. Her behavior is normal.   Laboratory examination:   Recent Labs    07/23/19 1338  NA 141  K 3.7  CL 107  CO2 23  GLUCOSE 108*  BUN 11  CREATININE 0.71  CALCIUM 9.3  GFRNONAA >60  GFRAA >60   CrCl cannot be calculated (Patient's most recent lab result is older than the maximum 21 days allowed.).  CMP Latest Ref Rng & Units 07/23/2019  Glucose 70 - 99 mg/dL 478(G)  BUN 6 - 20 mg/dL 11  Creatinine 9.56 - 2.13 mg/dL 0.86  Sodium 578 - 469 mmol/L 141  Potassium 3.5 - 5.1 mmol/L 3.7  Chloride 98 - 111 mmol/L 107  CO2 22 - 32 mmol/L 23  Calcium 8.9 - 10.3 mg/dL 9.3   CBC Latest Ref Rng & Units 07/23/2019  WBC 4.0 - 10.5 K/uL 5.0  Hemoglobin 12.0 - 15.0 g/dL 62.9  Hematocrit 52.8 - 46.0 % 43.9  Platelets 150 - 400 K/uL 226   Lipid Panel  No results found for: CHOL, TRIG, HDL, CHOLHDL, VLDL, LDLCALC, LDLDIRECT   HEMOGLOBIN A1C No results found for: HGBA1C, MPG   TSH No results for input(s): TSH in the last 8760 hours.  Medications and allergies   Allergies  Allergen Reactions  . Cephalosporins   . Other     Mycins and cyclins per patient   . Tetracyclines & Related      Current Outpatient Medications  Medication Instructions  . albuterol (PROVENTIL HFA) 108 (90 Base) MCG/ACT inhaler 1 puff, Inhalation, Every 6 hours PRN  . APPLE CIDER VINEGAR PO Oral  . aspirin EC 81 mg, Oral, Daily  . clobetasol ointment (TEMOVATE) 0.05 % Daily PRN  . ELDERBERRY PO Oral  . GARLIC PO Oral  . Ginger, Zingiber officinalis, (GINGER PO) Oral  . loratadine (CLARITIN) 10 mg, Daily  . NON FORMULARY 500 mg, Oral, Daily  . Turmeric (QC TUMERIC COMPLEX PO) Oral  . vitamin B-12 (CYANOCOBALAMIN) 1,000 mcg, Oral, Daily  . Vitamin D3 2,000 Units, Oral, Daily  .  Zinc 22.5 MG TABS 1 tablet, Oral, Every other day    Radiology:  No results found.  Cardiac Studies:  Coronary artery calcification scoring: 11/03/2019:  1. LAD coronary artery calcification.  2. Total Agatston Score: 93.5  3. MESA age and sex matched database percentile: 21  EKG November 14, 2019: Normal sinus rhythm ventricular rate of 67 bpm, normal axis deviation, no underlying ischemia or injury pattern.  Assessment     ICD-10-CM   1. Agatston coronary artery calcium score less than 100  R93.1 Lipid Profile    HgB A1c  2. Dyspnea on exertion  R06.00 EKG 12-Lead  PCV ECHOCARDIOGRAM COMPLETE  3. Former smoker  Z87.891      No orders of the defined types were placed in this encounter.   Medications Discontinued During This Encounter  Medication Reason  . montelukast (SINGULAIR) 10 MG tablet Error     Recommendations:   Deborah May  is a 59 y.o. with COPD followed by Dr. Lamonte Sakai, family history of heart disease, former smoker that quit in March 2020, referred to [place] for evaluation of coronary calcification on CT scan.   Coronary artery calcification score less than 100 AU: -continue ASA 81mg  po qday -check lipid panel  -check a1c -Plan echocardiogram to evaluate LV function and structural heart disease. -Continue risk factor modifications.  New occasions. -Patient is congratulated and emphasized importance of continued smoking cessation.  Dyspnea on exertion: -Educated the patient that her symptoms of effort related dyspnea could be secondary to either her underlying COPD and/or heart disease. -In the setting of coronary calcification score of would recommend a 2D echocardiogram to evaluate for structural heart disease and LV function. -Patient is currently exercising on a daily basis and walking at least 10,000 steps without any angina.  Patient is educated on seeking medical attention sooner if she has typical chest pain as discussed in the office or her  symptoms increase in intensity frequency or duration.  Patient verbalized understanding.  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  Follow-up visit in 4 weeks to review lab results, echocardiogram, symptom management, and discuss the possibility of undergoing stress test.  Patient's questions and concerns were addressed to her satisfaction.  Thank you for allowing [place] to participate in the care of Deborah May please do not hesitate to reach out if any questions or concerns arise.  Rex Kras, DO, [facility]. [place] Office: [phone]

## 2019-11-22 ENCOUNTER — Other Ambulatory Visit: Payer: Self-pay | Admitting: Obstetrics and Gynecology

## 2019-11-22 ENCOUNTER — Other Ambulatory Visit: Payer: BC Managed Care – PPO

## 2019-11-22 DIAGNOSIS — R928 Other abnormal and inconclusive findings on diagnostic imaging of breast: Secondary | ICD-10-CM

## 2019-12-06 ENCOUNTER — Other Ambulatory Visit: Payer: BC Managed Care – PPO

## 2019-12-14 ENCOUNTER — Ambulatory Visit: Payer: BC Managed Care – PPO | Admitting: Cardiology

## 2019-12-27 ENCOUNTER — Other Ambulatory Visit: Payer: Self-pay

## 2019-12-27 ENCOUNTER — Ambulatory Visit
Admission: RE | Admit: 2019-12-27 | Discharge: 2019-12-27 | Disposition: A | Discharge status: Home / Self Care | Payer: BC Managed Care – PPO | Source: Ambulatory Visit | Attending: Obstetrics and Gynecology | Admitting: Obstetrics and Gynecology

## 2019-12-27 ENCOUNTER — Ambulatory Visit: Payer: BC Managed Care – PPO

## 2019-12-27 DIAGNOSIS — R928 Other abnormal and inconclusive findings on diagnostic imaging of breast: Secondary | ICD-10-CM

## 2020-06-26 ENCOUNTER — Emergency Department (HOSPITAL_BASED_OUTPATIENT_CLINIC_OR_DEPARTMENT_OTHER): Payer: BC Managed Care – PPO

## 2020-06-26 ENCOUNTER — Encounter (HOSPITAL_BASED_OUTPATIENT_CLINIC_OR_DEPARTMENT_OTHER): Payer: Self-pay

## 2020-06-26 ENCOUNTER — Other Ambulatory Visit: Payer: Self-pay

## 2020-06-26 ENCOUNTER — Emergency Department (HOSPITAL_BASED_OUTPATIENT_CLINIC_OR_DEPARTMENT_OTHER)
Admission: EM | Admit: 2020-06-26 | Discharge: 2020-06-26 | Disposition: A | Discharge status: Home / Self Care | Payer: BC Managed Care – PPO | Attending: Emergency Medicine | Admitting: Emergency Medicine

## 2020-06-26 DIAGNOSIS — Z7982 Long term (current) use of aspirin: Secondary | ICD-10-CM | POA: Diagnosis not present

## 2020-06-26 DIAGNOSIS — Z91040 Latex allergy status: Secondary | ICD-10-CM | POA: Diagnosis not present

## 2020-06-26 DIAGNOSIS — Z79899 Other long term (current) drug therapy: Secondary | ICD-10-CM | POA: Insufficient documentation

## 2020-06-26 DIAGNOSIS — R1084 Generalized abdominal pain: Secondary | ICD-10-CM | POA: Insufficient documentation

## 2020-06-26 DIAGNOSIS — J449 Chronic obstructive pulmonary disease, unspecified: Secondary | ICD-10-CM | POA: Diagnosis not present

## 2020-06-26 DIAGNOSIS — Z87891 Personal history of nicotine dependence: Secondary | ICD-10-CM | POA: Insufficient documentation

## 2020-06-26 DIAGNOSIS — R197 Diarrhea, unspecified: Secondary | ICD-10-CM | POA: Diagnosis not present

## 2020-06-26 HISTORY — DX: Diverticulosis of intestine, part unspecified, without perforation or abscess without bleeding: K57.90

## 2020-06-26 LAB — OCCULT BLOOD X 1 CARD TO LAB, STOOL: Fecal Occult Bld: POSITIVE — AB

## 2020-06-26 LAB — COMPREHENSIVE METABOLIC PANEL
ALT: 22 U/L (ref 0–44)
AST: 24 U/L (ref 15–41)
Albumin: 3.9 g/dL (ref 3.5–5.0)
Alkaline Phosphatase: 64 U/L (ref 38–126)
Anion gap: 11 (ref 5–15)
BUN: 13 mg/dL (ref 8–23)
CO2: 22 mmol/L (ref 22–32)
Calcium: 9 mg/dL (ref 8.9–10.3)
Chloride: 105 mmol/L (ref 98–111)
Creatinine, Ser: 0.7 mg/dL (ref 0.44–1.00)
GFR calc Af Amer: >60 mL/min (ref >60)
GFR calc non Af Amer: >60 mL/min (ref >60)
Glucose, Bld: 114 mg/dL — ABNORMAL HIGH (ref 70–99)
Potassium: 3.5 mmol/L (ref 3.5–5.1)
Sodium: 138 mmol/L (ref 135–145)
Total Bilirubin: 0.4 mg/dL (ref 0.3–1.2)
Total Protein: 7.2 g/dL (ref 6.5–8.1)

## 2020-06-26 LAB — URINALYSIS, MICROSCOPIC (REFLEX)

## 2020-06-26 LAB — CBC
HCT: 40.9 % (ref 36.0–46.0)
Hemoglobin: 13.5 g/dL (ref 12.0–15.0)
MCH: 30.3 pg (ref 26.0–34.0)
MCHC: 33 g/dL (ref 30.0–36.0)
MCV: 91.7 fL (ref 80.0–100.0)
Platelets: 193 10*3/uL (ref 150–400)
RBC: 4.46 MIL/uL (ref 3.87–5.11)
RDW: 12.9 % (ref 11.5–15.5)
WBC: 6.7 10*3/uL (ref 4.0–10.5)
nRBC: 0 % (ref 0.0–0.2)

## 2020-06-26 LAB — URINALYSIS, ROUTINE W REFLEX MICROSCOPIC
Bilirubin Urine: NEGATIVE
Glucose, UA: NEGATIVE mg/dL
Hgb urine dipstick: NEGATIVE
Ketones, ur: NEGATIVE mg/dL
Nitrite: NEGATIVE
Protein, ur: NEGATIVE mg/dL
Specific Gravity, Urine: 1.015 (ref 1.005–1.030)
pH: 6 (ref 5.0–8.0)

## 2020-06-26 LAB — LIPASE, BLOOD: Lipase: 26 U/L (ref 11–51)

## 2020-06-26 NOTE — Discharge Instructions (Addendum)
Home to rest, stay hydrated. Follow up with your GI, call to schedule an appointment. Return to the ER for worsening or concerning symptoms.

## 2020-06-26 NOTE — ED Triage Notes (Signed)
Pt c/o abd cramping-loose stools with the last episode having blood and mucus-sx started last night-states she was sent by GI doctor-NAD-steady gait

## 2020-06-26 NOTE — ED Provider Notes (Signed)
[facility] EMERGENCY DEPARTMENT Provider Note   CSN: [id number] Arrival date & time: 06/26/20  1210     History Chief Complaint  Patient presents with  . Abdominal Pain    Deborah May is a 59 y.o. female.  59 year old female with complaint of abdominal cramping and loose stools throughout the night last night. Patient noticed loose stools with bright red blood and mucous this morning. States she had diaphoresis at one point, denies fevers or chills, chest pain or shortness of breath. Cramping throughout the abdomen last night, more so to the left side, at this time has slight epigastric discomfort but overall felling better. Reports similar episode 7 years ago, more significant that last nights episode, did not seek care at that time. Patient is followed by GI, last colonoscopy was in 2019, told diverticula otherwise normal.         Past Medical History:  Diagnosis Date  . Allergy   . COPD (chronic obstructive pulmonary disease) (HCC)   . Diverticulosis   . Vitamin B12 deficiency   . Vitamin D deficiency     Patient Active Problem List   Diagnosis Date Noted  . 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 COPD (chronic obstructive pulmonary disease) (HCC) 03/30/2019  . Allergic rhinitis 03/30/2019  . Genetic testing 03/04/2016  . Family history of ovarian cancer 02/12/2016  . Family history of breast cancer in female 02/12/2016    Past Surgical History:  Procedure Laterality Date  . ABDOMINAL HYSTERECTOMY    . ANKLE SURGERY    . CERVICAL SPINE SURGERY    . SHOULDER SURGERY Left      OB History   No obstetric history on file.     Family History  Problem Relation Age of Onset  . Congestive Heart Failure Father 76  . Cancer Sister 30       appendiceal  . ALS Mother   . Ovarian cancer Maternal Aunt 80  . Ovarian cancer Maternal Grandmother 78  . Stroke Paternal Grandmother   . Angina Paternal Grandmother   . Lung cancer Paternal Grandfather         smoker; d. 50s-60s  . Breast cancer Other 33  . Stroke Maternal Aunt   . Mesothelioma Maternal Uncle        worked in [employer]; d. approx 80  . Skin cancer Cousin        maternal 1st cousin w/ NOS skin cancer  . Liver cancer Cousin        maternal 1st cousin d. late 36s; +cirrhosis from alcohol abuse  . Endometriosis Cousin        s/p TAH  . Other Cousin        hx of benign teratoma  . Alzheimer's disease Other        paternal great aunt; late-onset  . Heart attack Other        (x2 paternal great uncles) and one paternal great aunt in their 12s-80s    Social History   Tobacco Use  . Smoking status: Former Smoker    Packs/day: 0.75    Years: 40.00    Pack years: 30.00    Types: Cigarettes    Quit date: 02/21/2019    Years since quitting: 1.3  . Smokeless tobacco: Never Used  . Tobacco comment: never quite 1ppd  Vaping Use  . Vaping Use: Never used  Substance Use Topics  . Alcohol use: Yes    Comment: occ  .  Drug use: No    Home Medications Prior to Admission medications   Medication Sig Start Date End Date Taking? Authorizing Provider  aspirin EC 81 MG tablet Take 81 mg by mouth daily.   Yes [provider]  Cholecalciferol (VITAMIN D3) 50 MCG (2000 UT) capsule Take 2,000 Units by mouth daily.   Yes [provider]  vitamin B-12 (CYANOCOBALAMIN) 1000 MCG tablet Take 1,000 mcg by mouth daily.   Yes [provider]  Zinc 22.5 MG TABS Take 1 tablet by mouth every other day.   Yes [provider]  albuterol (PROVENTIL HFA) 108 (90 Base) MCG/ACT inhaler Inhale 1 puff into the lungs every 6 (six) hours as needed for wheezing or shortness of breath. 05/30/19   Lanelle Bal, MD  APPLE CIDER VINEGAR PO Take by mouth.    [provider]  clobetasol ointment (TEMOVATE) 0.05 % daily as needed. 10/12/19   [provider]  ELDERBERRY PO Take by mouth.    [provider]  GARLIC PO Take by mouth.     [provider]  Ginger, Zingiber officinalis, (GINGER PO) Take by mouth.    [provider]  loratadine (CLARITIN) 10 MG tablet Take 10 mg by mouth daily.    [provider]  NON FORMULARY Take 500 mg by mouth daily.    [provider]  Turmeric (QC TUMERIC COMPLEX PO) Take by mouth.    [provider]    Allergies    Cephalosporins, Latex, Morphine and related, Other, and Tetracyclines & related  Review of Systems   Review of Systems  Constitutional: Positive for diaphoresis. Negative for chills and fever.  Respiratory: Negative for shortness of breath.   Cardiovascular: Negative for chest pain.  Gastrointestinal: Positive for abdominal pain, blood in stool and diarrhea. Negative for constipation, nausea and vomiting.  Genitourinary: Negative for dysuria and frequency.  Musculoskeletal: Negative for back pain.  Skin: Negative for rash and wound.  Allergic/Immunologic: Negative for immunocompromised state.  Neurological: Negative for dizziness and weakness.  Hematological: Negative for adenopathy.    Physical Exam Updated Vital Signs BP 134/62 (BP Location: Left Arm)   Pulse 81   Temp 98.3 F (36.8 C) (Oral)   Resp 16   Ht 5\' 3"  (1.6 m)   Wt 71.7 kg   SpO2 98%   BMI 28.01 kg/m   Physical Exam Vitals and nursing note reviewed.  Constitutional:      General: She is not in acute distress.    Appearance: She is well-developed. She is not diaphoretic.  HENT:     Head: Normocephalic and atraumatic.  Cardiovascular:     Rate and Rhythm: Normal rate and regular rhythm.     Heart sounds: Normal heart sounds.  Pulmonary:     Effort: Pulmonary effort is normal.     Breath sounds: Normal breath sounds.  Abdominal:     Palpations: Abdomen is soft.     Tenderness: There is abdominal tenderness in the epigastric area. There is no right CVA tenderness or left CVA tenderness.  Skin:    General: Skin is warm and dry.     Findings: No  erythema or rash.  Neurological:     Mental Status: She is alert and oriented to person, place, and time.  Psychiatric:        Behavior: Behavior normal.     ED Results / Procedures / Treatments   Labs (all labs ordered are listed, but only abnormal results are  displayed) Labs Reviewed  COMPREHENSIVE METABOLIC PANEL - Abnormal; Notable for the following components:      Result Value   Glucose, Bld 114 (*)    All other components within normal limits  URINALYSIS, ROUTINE W REFLEX MICROSCOPIC - Abnormal; Notable for the following components:   Leukocytes,Ua SMALL (*)    All other components within normal limits  URINALYSIS, MICROSCOPIC (REFLEX) - Abnormal; Notable for the following components:   Bacteria, UA FEW (*)    All other components within normal limits  OCCULT BLOOD X 1 CARD TO LAB, STOOL - Abnormal; Notable for the following components:   Fecal Occult Bld POSITIVE (*)    All other components within normal limits  LIPASE, BLOOD  CBC  POC OCCULT BLOOD, ED    EKG None  Radiology CT Abdomen Pelvis Wo Contrast  Result Date: 06/26/2020 CLINICAL DATA:  Abdominal cramping, blood in stool EXAM: CT ABDOMEN AND PELVIS WITHOUT CONTRAST TECHNIQUE: Multidetector CT imaging of the abdomen and pelvis was performed following the standard protocol without IV contrast. COMPARISON:  None. FINDINGS: Lower chest: No acute pleural or parenchymal lung disease. Hepatobiliary: No focal liver abnormality is seen. No gallstones, gallbladder wall thickening, or biliary dilatation. Pancreas: Unremarkable. No pancreatic ductal dilatation or surrounding inflammatory changes. Spleen: Normal in size without focal abnormality. Adrenals/Urinary Tract: Adrenal glands are unremarkable. Kidneys are normal, without renal calculi, focal lesion, or hydronephrosis. Bladder is unremarkable. Stomach/Bowel: No bowel obstruction or ileus. Normal appendix right lower quadrant. No bowel wall thickening or inflammatory  change. Vascular/Lymphatic: Aortic atherosclerosis. No enlarged abdominal or pelvic lymph nodes. Reproductive: Status post hysterectomy. No adnexal masses. Other: No free fluid or free gas.  No abdominal wall hernia. Musculoskeletal: No acute or destructive bony lesions. Reconstructed images demonstrate no additional findings. IMPRESSION: 1. No acute intra-abdominal or intrapelvic process. 2.  Aortic Atherosclerosis (ICD10-I70.0). Electronically Signed   By: Sharlet Salina M.D.   On: 06/26/2020 15:43    Procedures Procedures (including critical care time)  Medications Ordered in ED Medications - No data to display  ED Course  I have reviewed the triage vital signs and the nursing notes.  Pertinent labs & imaging results that were available during my care of the patient were reviewed by me and considered in my medical decision making (see chart for details).  Clinical Course as of Jun 26 1752  Wed Jun 26, 2020  1983 59 year old female with abdominal pain with bright red blood with loose/mucous stools, onset last night, improved since onset. CT without acute findings.  Labs reassuring including CBC, CMP, lipase. UA with small leukocytes and 11-20WBC with few bacteria, no urinary symptoms. Hemoccult positive on self collect, defers rectal exam. Discussed with patient, recommend follow up with her GI, call to schedule an appointment. Return to ER for fever, worsening or concerning symptoms.    [LM]    Clinical Course User Index [LM] Alden Hipp   MDM Rules/Calculators/A&P                          Final Clinical Impression(s) / ED Diagnoses Final diagnoses:  Generalized abdominal pain  Bloody diarrhea    Rx / DC Orders ED Discharge Orders    None       Jeannie Fend, PA-C 06/26/20 1754    Alvira Monday, MD 06/27/20 1045

## 2020-06-26 NOTE — ED Notes (Signed)
Patient transported to CT 

## 2020-10-20 ENCOUNTER — Encounter (HOSPITAL_BASED_OUTPATIENT_CLINIC_OR_DEPARTMENT_OTHER): Payer: Self-pay

## 2020-10-20 ENCOUNTER — Other Ambulatory Visit: Payer: Self-pay

## 2020-10-20 ENCOUNTER — Emergency Department (HOSPITAL_BASED_OUTPATIENT_CLINIC_OR_DEPARTMENT_OTHER): Payer: BC Managed Care – PPO

## 2020-10-20 ENCOUNTER — Emergency Department (HOSPITAL_BASED_OUTPATIENT_CLINIC_OR_DEPARTMENT_OTHER)
Admission: EM | Admit: 2020-10-20 | Discharge: 2020-10-20 | Disposition: A | Discharge status: Home / Self Care | Payer: BC Managed Care – PPO | Attending: Emergency Medicine | Admitting: Emergency Medicine

## 2020-10-20 DIAGNOSIS — R0789 Other chest pain: Secondary | ICD-10-CM | POA: Diagnosis present

## 2020-10-20 DIAGNOSIS — U071 COVID-19: Secondary | ICD-10-CM | POA: Insufficient documentation

## 2020-10-20 DIAGNOSIS — Z7982 Long term (current) use of aspirin: Secondary | ICD-10-CM | POA: Diagnosis not present

## 2020-10-20 DIAGNOSIS — R079 Chest pain, unspecified: Secondary | ICD-10-CM

## 2020-10-20 DIAGNOSIS — Z87891 Personal history of nicotine dependence: Secondary | ICD-10-CM | POA: Insufficient documentation

## 2020-10-20 DIAGNOSIS — J449 Chronic obstructive pulmonary disease, unspecified: Secondary | ICD-10-CM | POA: Diagnosis not present

## 2020-10-20 DIAGNOSIS — Z91040 Latex allergy status: Secondary | ICD-10-CM | POA: Insufficient documentation

## 2020-10-20 LAB — CBC WITH DIFFERENTIAL/PLATELET
Abs Immature Granulocytes: 0.01 10*3/uL (ref 0.00–0.07)
Basophils Absolute: 0 10*3/uL (ref 0.0–0.1)
Basophils Relative: 0 %
Eosinophils Absolute: 0 10*3/uL (ref 0.0–0.5)
Eosinophils Relative: 1 %
HCT: 40.1 % (ref 36.0–46.0)
Hemoglobin: 13.5 g/dL (ref 12.0–15.0)
Immature Granulocytes: 0 %
Lymphocytes Relative: 19 %
Lymphs Abs: 0.7 10*3/uL (ref 0.7–4.0)
MCH: 30.8 pg (ref 26.0–34.0)
MCHC: 33.7 g/dL (ref 30.0–36.0)
MCV: 91.6 fL (ref 80.0–100.0)
Monocytes Absolute: 0.4 10*3/uL (ref 0.1–1.0)
Monocytes Relative: 12 %
Neutro Abs: 2.7 10*3/uL (ref 1.7–7.7)
Neutrophils Relative %: 68 %
Platelets: 151 10*3/uL (ref 150–400)
RBC: 4.38 MIL/uL (ref 3.87–5.11)
RDW: 12.8 % (ref 11.5–15.5)
WBC: 3.8 10*3/uL — ABNORMAL LOW (ref 4.0–10.5)
nRBC: 0 % (ref 0.0–0.2)

## 2020-10-20 LAB — BASIC METABOLIC PANEL
Anion gap: 8 (ref 5–15)
BUN: 17 mg/dL (ref 8–23)
CO2: 23 mmol/L (ref 22–32)
Calcium: 8.7 mg/dL — ABNORMAL LOW (ref 8.9–10.3)
Chloride: 107 mmol/L (ref 98–111)
Creatinine, Ser: 0.79 mg/dL (ref 0.44–1.00)
GFR, Estimated: >60 mL/min (ref >60)
Glucose, Bld: 110 mg/dL — ABNORMAL HIGH (ref 70–99)
Potassium: 3.2 mmol/L — ABNORMAL LOW (ref 3.5–5.1)
Sodium: 138 mmol/L (ref 135–145)

## 2020-10-20 LAB — TROPONIN I (HIGH SENSITIVITY): Troponin I (High Sensitivity): 3 ng/L (ref <18)

## 2020-10-20 MED ORDER — POTASSIUM CHLORIDE CRYS ER 20 MEQ PO TBCR
40.0000 meq | EXTENDED_RELEASE_TABLET | Freq: Once | ORAL | Status: AC
  Administered 2020-10-20: 40 meq via ORAL
  Filled 2020-10-20: qty 2

## 2020-10-20 NOTE — ED Provider Notes (Signed)
[facility] EMERGENCY DEPARTMENT Provider Note   CSN: [id number] Arrival date & time: 10/20/20  1216     History Chief Complaint  Patient presents with  . Chest Pain    Also heaviness in both arms    Deborah IbaJill G May is a 60 y.o. female with PMH significant for COPD and recent COVID-19 diagnosis who presents the ED with complaints of chest pressure.  Patient reports that she was diagnosed with COVID-19 at a [facility] in [place] on 10/16/2020.  On my examination, patient reports that she became symptomatic of COVID-19 on 10/12/2020 with a symptoms of sinus congestion, generalized body aches, rigors, and mild nonproductive cough.  She reports that her symptoms have largely improved, but is still experiencing generalized body aches.  She has been treating her symptoms with herbalist medicine, with relatively good effect.  She states that she has improved considerably, but over the course of the past couple of days she has been experiencing bilateral chest discomfort described as a "pressure" with radiation to her to her left shoulder.  She also states that she felt mildly clammy yesterday.  She denies any associated shortness of breath, nausea, emesis, dizziness, or other symptoms.  There was no exertional component and she states that there are no obvious aggravating or alleviating factors.  She states that the symptoms last approximately 10 minutes before subsiding.  No family history of premature cardiac death.  No personal history of cardiac syncope.  Patient is followed [facility] and had a cardiac catheterization performed in early 2021 which demonstrated no significant atherosclerosis.  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  Patient Dors is a 40-pack-year smoking history, quit tobacco 2 years ago.  She does not take a statin because she states that her lipids have improved with  dietary and herbalist modifications.    She called her primary care provider who encouraged her to come to the ED for evaluation given her chest pressure out of concern for myocarditis or pulmonary embolism.  She denies any history of clots or clotting disorder.  No hemoptysis.  She denies any shortness of breath or active chest pain on my exam.  HPI  Past Medical History:  Diagnosis Date  . Allergy   . COPD (chronic obstructive pulmonary disease) (HCC)   . Diverticulosis   . Vitamin B12 deficiency   . Vitamin D deficiency     Patient Active Problem List   Diagnosis Date Noted  . 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 COPD (chronic obstructive pulmonary disease) (HCC) 03/30/2019  . Allergic rhinitis 03/30/2019  . Genetic testing 03/04/2016  . Family history of ovarian cancer 02/12/2016  . Family history of breast cancer in female 02/12/2016    Past Surgical History:  Procedure Laterality Date  . ABDOMINAL HYSTERECTOMY    . ANKLE SURGERY    . CERVICAL SPINE SURGERY    . SHOULDER SURGERY Left      OB History   No obstetric history on file.     Family History  Problem Relation Age of Onset  . Congestive Heart Failure Father [id number]  . Cancer Sister 7858       appendiceal  . ALS Mother   . Ovarian cancer Maternal Aunt 80  . Ovarian cancer Maternal Grandmother [id number]  . Stroke Paternal Grandmother   . Angina Paternal Grandmother   . Lung cancer Paternal Grandfather  smoker; d. 50s-60s  . Breast cancer Other 33  . Stroke Maternal Aunt   . Mesothelioma Maternal Uncle        worked in [employer]; d. approx 80  . Skin cancer Cousin        maternal 1st cousin w/ NOS skin cancer  . Liver cancer Cousin        maternal 1st cousin d. late 73s; +cirrhosis from alcohol abuse  . Endometriosis Cousin        s/p TAH  . Other Cousin        hx of benign teratoma  . Alzheimer's disease Other        paternal great aunt; late-onset  . Heart attack Other        (x2 paternal  great uncles) and one paternal great aunt in their 60s-80s    Social History   Tobacco Use  . Smoking status: Former Smoker    Packs/day: 0.75    Years: 40.00    Pack years: 30.00    Types: Cigarettes    Quit date: 02/21/2019    Years since quitting: 1.6  . Smokeless tobacco: Never Used  . Tobacco comment: never quite 1ppd  Vaping Use  . Vaping Use: Never used  Substance Use Topics  . Alcohol use: Yes    Comment: occ  . Drug use: No    Home Medications Prior to Admission medications   Medication Sig Start Date End Date Taking? Authorizing Provider  albuterol (PROVENTIL HFA) 108 (90 Base) MCG/ACT inhaler Inhale 1 puff into the lungs every 6 (six) hours as needed for wheezing or shortness of breath. 05/30/19   Lanelle Bal, MD  APPLE CIDER VINEGAR PO Take by mouth.    [provider]  aspirin EC 81 MG tablet Take 81 mg by mouth daily.    [provider]  Cholecalciferol (VITAMIN D3) 50 MCG (2000 UT) capsule Take 2,000 Units by mouth daily.    [provider]  clobetasol ointment (TEMOVATE) 0.05 % daily as needed. 10/12/19   [provider]  ELDERBERRY PO Take by mouth.    [provider]  GARLIC PO Take by mouth.    [provider]  Ginger, Zingiber officinalis, (GINGER PO) Take by mouth.    [provider]  loratadine (CLARITIN) 10 MG tablet Take 10 mg by mouth daily.    [provider]  NON FORMULARY Take 500 mg by mouth daily.    [provider]  Turmeric (QC TUMERIC COMPLEX PO) Take by mouth.    [provider]  vitamin B-12 (CYANOCOBALAMIN) 1000 MCG tablet Take 1,000 mcg by mouth daily.    [provider]  Zinc 22.5 MG TABS Take 1 tablet by mouth every other day.    [provider]    Allergies    Cephalosporins, Latex, Morphine and related, Other, and Tetracyclines & related  Review of Systems   Review of Systems  All other systems reviewed and are  negative.   Physical Exam Updated Vital Signs BP 112/65 (BP Location: Right Arm)   Pulse 65   Temp 98.2 F (36.8 C) (Oral)   Resp 14   Ht 5\' 3"  (1.6 m)   Wt 70.3 kg   SpO2 97%   BMI 27.46 kg/m   Physical Exam Vitals and nursing note reviewed. Exam conducted with a chaperone present.  Constitutional:      General: She is not in acute distress.    Appearance: She is  not toxic-appearing.  HENT:     Head: Normocephalic and atraumatic.  Eyes:     General: No scleral icterus.    Conjunctiva/sclera: Conjunctivae normal.  Cardiovascular:     Rate and Rhythm: Normal rate and regular rhythm.     Pulses: Normal pulses.     Heart sounds: Normal heart sounds.  Pulmonary:     Effort: Pulmonary effort is normal. No respiratory distress.     Breath sounds: Normal breath sounds. No wheezing or rales.     Comments: CTA bilaterally.  No increased work of breathing.  No tachypnea.  Oxygenating at 98% on RA at rest. Abdominal:     General: Abdomen is flat. There is no distension.     Palpations: Abdomen is soft.     Tenderness: There is no abdominal tenderness. There is no guarding.  Musculoskeletal:        General: Normal range of motion.  Skin:    General: Skin is dry.  Neurological:     General: No focal deficit present.     Mental Status: She is alert and oriented to person, place, and time.     GCS: GCS eye subscore is 4. GCS verbal subscore is 5. GCS motor subscore is 6.     Cranial Nerves: No cranial nerve deficit.     Sensory: No sensory deficit.     Coordination: Coordination normal.     Gait: Gait normal.  Psychiatric:        Mood and Affect: Mood normal.        Behavior: Behavior normal.        Thought Content: Thought content normal.     ED Results / Procedures / Treatments   Labs (all labs ordered are listed, but only abnormal results are displayed) Labs Reviewed  CBC WITH DIFFERENTIAL/PLATELET - Abnormal; Notable for the following components:      Result Value    WBC 3.8 (*)    All other components within normal limits  BASIC METABOLIC PANEL - Abnormal; Notable for the following components:   Potassium 3.2 (*)    Glucose, Bld 110 (*)    Calcium 8.7 (*)    All other components within normal limits  TROPONIN I (HIGH SENSITIVITY)    EKG EKG Interpretation  Date/Time:  Sunday October 20 2020 12:26:46 EST Ventricular Rate:  75 PR Interval:    QRS Duration: 90 QT Interval:  390 QTC Calculation: 436 R Axis:   78 Text Interpretation: Sinus rhythm Borderline short PR interval Consider left ventricular hypertrophy Confirmed by Marianna Fuss ([id number]) on 10/20/2020 1:04:49 PM   Radiology DG Chest Portable 1 View  Result Date: 10/20/2020 CLINICAL DATA:  Chest pressure. EXAM: PORTABLE CHEST 1 VIEW COMPARISON:  Chest radiograph 07/23/2019 FINDINGS: The cardiomediastinal contours are within normal limits. The lungs are hyperinflated. The lungs are clear. No pneumothorax or pleural effusion. No acute finding in the visualized skeleton. IMPRESSION: No acute cardiopulmonary process.  COPD. Electronically Signed   By: Emmaline Kluver M.D.   On: 10/20/2020 13:58    Procedures Procedures (including critical care time)  Medications Ordered in ED Medications - No data to display  ED Course  I have reviewed the triage vital signs and the nursing notes.  Pertinent labs & imaging results that were available during my care of the patient were reviewed by me and considered in my medical decision making (see chart for details).    MDM Rules/Calculators/A&P HEAR Score: 4  Deborah May was evaluated in Emergency Department on 10/20/2020 for the symptoms described in the history of present illness. She was evaluated in the context of the global COVID-19 pandemic, which necessitated consideration that the patient might be at risk for infection with the SARS-CoV-2 virus that causes COVID-19. Institutional protocols and algorithms that  pertain to the evaluation of patients at risk for COVID-19 are in a state of rapid change based on information released by regulatory bodies including the CDC and federal and state organizations. These policies and algorithms were followed during the patient's care in the ED.  I personally reviewed patient's medical chart and all notes from triage and staff during today's encounter. I have also ordered and reviewed all labs and imaging that I felt to be medically necessary in the evaluation of this patient's complaints and with consideration of their with their physical exam. If needed, translation services were available and utilized.   While patient has nonobstructive coronary artery disease, given her description of intermittent chest discomfort radiating towards her left shoulder over the course of the past couple of days with associated clamminess, will obtain a one-time troponin in addition to basic laboratory work-up.  We will also obtain plain films of chest.  EKG is personally reviewed and without obvious evidence of cardiac ischemia.  Her chest discomfort is nonspecific.  PERC criteria notable only for her age.  Negative (0 points) per Anner Crete' criteria for PE.  If her work-up is reassuring, she is reasonable for outpatient follow-up with a cardiologist.  Laboratory work-up reveals mild hypokalemia 3.2, but is otherwise entirely unremarkable.  Troponin within normal limits at 3.  Given chronicity of intermittent symptoms, do not feel as though we need to trend.  Discussed case with Dr. Stevie Kern who evaluated patient and agrees with assessment and plan.   Final Clinical Impression(s) / ED Diagnoses Final diagnoses:  Nonspecific chest pain    Rx / DC Orders ED Discharge Orders    None       Lorelee New, PA-C 10/20/20 1448    Milagros Loll, MD 10/23/20 1750

## 2020-10-20 NOTE — Discharge Instructions (Addendum)
Your chest pain here is nonspecific and your laboratory work-up, physical exam, and imaging is personally reviewed and reassuring.  You do have a mildly low potassium which was replenished here in the ED.  You will need laboratory recheck to ensure correction.    Please follow-up with your primary care provider for ongoing evaluation and management.  Return to the ED or seek immediate medical attention should you experience any new or worsening symptoms.

## 2020-10-20 NOTE — ED Triage Notes (Signed)
Dx with covid Wednesday at Urgent Care in [place], at [facility] Chest pressure started two days ago has not gotten worse or better for the past two days, states the pressure goes across her chest in waves and down her arms, denies radiation to back, jaw or shoulders, denies any sweating or nausea with the pressure

## 2021-02-05 ENCOUNTER — Ambulatory Visit (INDEPENDENT_AMBULATORY_CARE_PROVIDER_SITE_OTHER): Payer: BC Managed Care – PPO | Admitting: Emergency Medicine

## 2021-02-05 ENCOUNTER — Other Ambulatory Visit: Payer: Self-pay

## 2021-02-05 ENCOUNTER — Encounter: Payer: Self-pay | Admitting: Emergency Medicine

## 2021-02-05 DIAGNOSIS — J449 Chronic obstructive pulmonary disease, unspecified: Secondary | ICD-10-CM | POA: Diagnosis not present

## 2021-02-05 NOTE — Patient Instructions (Signed)
Based on your great functional capacity and in the absence of any significant symptoms your COPD puts you at only low to moderate risk for general anesthesia or your surgery.  There are no barriers to proceeding as long as you continue to feel well.  We will send a copy of our notes to Dr. Jerl Santos Please keep your albuterol available to use 2 puffs if you do need it for shortness of breath, chest tightness, wheezing. You may benefit from getting the COVID-19 vaccine going forward. Follow with Dr. Delton Coombes annually or sooner if you have any problems with your breathing.

## 2021-02-05 NOTE — Progress Notes (Signed)
Subjective:    Patient ID: Deborah May, female    DOB: 1961-05-11, 60 y.o.   MRN: [id number]  HPI  ROV 08/09/2019 --this a follow-up visit for 60 year old former smoker (30 pack years) with allergic rhinitis and GOLD A or B COPD, last seen 05/30/2019.  I had left her off of scheduled bronchodilators given her minimal symptoms and clinical stability.  She was seen in the emergency department 07/23/2019 and treated for possible acute exacerbation of COPD.  She was experiencing 2 to 3 days of subjective dyspnea, pain in her upper extremities. The SOB was at rest and w exertion, did not really seem to get better with albuterol. May have happened after some grass exposure.  COVID-19 testing was negative.  She was treated with a course of prednisone - made the SOB a bit better.  She has had a cough productive of yellow mucus especially in the mornings, a lot of throat clearing.  She was since prescribed levofloxacin by Dr Kevan Ny beginning 10/30, is completing this prescription.  She remains on loratadine.   ROV 02/05/21 --60 year old woman with a history of tobacco (30 pack years) and mild COPD.  Also allergic rhinitis.  She had COVID-19 in October 2020 with a clear chest x-ray.  She has an albuterol inhaler that she can use as needed, has not required. She describes her functional capacity as very good - able to exercise. Has not had any exacerbations, never needs her albuterol. She can sing, walk, goes to the gym. She has calcium deposits in her R shoulder and needs surgery.   PFT with moderate obstruction 05/30/2019.      Review of Systems  Constitutional: Negative for fever and unexpected weight change.  HENT: Negative for congestion, dental problem, ear pain, nosebleeds, postnasal drip, rhinorrhea, sinus pressure, sneezing, sore throat and trouble swallowing.   Eyes: Negative for redness and itching.  Respiratory: Positive for cough and shortness of breath. Negative for chest tightness and  wheezing.   Cardiovascular: Negative for palpitations and leg swelling.  Gastrointestinal: Negative for nausea and vomiting.  Genitourinary: Negative for dysuria.  Musculoskeletal: Negative for joint swelling.  Skin: Negative for rash.  Neurological: Negative for headaches.  Hematological: Does not bruise/bleed easily.  Psychiatric/Behavioral: Negative for dysphoric mood. The patient is not nervous/anxious.      Allergies  Allergen Reactions  . Cephalosporins   . Latex   . Morphine And Related   . Other     Mycins and cyclins per patient   . Tetracyclines & Related      Outpatient Medications Prior to Visit  Medication Sig Dispense Refill  . albuterol (PROVENTIL HFA) 108 (90 Base) MCG/ACT inhaler Inhale 1 puff into the lungs every 6 (six) hours as needed for wheezing or shortness of breath. 18 g 1  . APPLE CIDER VINEGAR PO Take by mouth.    Marland Kitchen aspirin EC 81 MG tablet Take 81 mg by mouth daily.    . Cholecalciferol (VITAMIN D3) 50 MCG (2000 UT) capsule Take 2,000 Units by mouth daily.    . clobetasol ointment (TEMOVATE) 0.05 % daily as needed.    Marland Kitchen ELDERBERRY PO Take by mouth.    Marland Kitchen GARLIC PO Take by mouth.    . Ginger, Zingiber officinalis, (GINGER PO) Take by mouth.    . loratadine (CLARITIN) 10 MG tablet Take 10 mg by mouth daily.    . NON FORMULARY Take 500 mg by mouth daily.    . Turmeric (QC TUMERIC COMPLEX  PO) Take by mouth.    . vitamin B-12 (CYANOCOBALAMIN) 1000 MCG tablet Take 1,000 mcg by mouth daily.    . Zinc 22.5 MG TABS Take 1 tablet by mouth every other day.     No facility-administered medications prior to visit.        Objective:    Vitals:   02/05/21 1447  BP: 136/72  Pulse: 76  Temp: 98.2 F (36.8 C)  TempSrc: Temporal  SpO2: 98%  Weight: 159 lb 6.4 oz (72.3 kg)  Height: 5\' 3"  (1.6 m)   Gen: Pleasant, well-nourished, in no distress,  normal affect  ENT: No lesions,  mouth clear,  oropharynx clear, no postnasal drip  Neck: No JVD, no  stridor  Lungs: No use of accessory muscles, no crackles or wheezing on normal respiration, no wheeze on forced expiration  Cardiovascular: RRR, heart sounds normal, no murmur or gallops, no peripheral edema  Musculoskeletal: No deformities, no cyanosis or clubbing  Neuro: alert, awake, non focal  Skin: Warm, no lesions or rash     Assessment & Plan:  COPD (chronic obstructive pulmonary disease) (HCC) She has moderate to moderately-severe obstruction by pulmonary function testing but her clinical status has always been better than is reflected by this physiology.  She does not exacerbate, does not have daily symptoms and has a fantastic functional capacity.  She is not on and has not required scheduled bronchodilators.  I think she is at low to moderate risk for general anesthesia and surgery.  There are no barriers to proceeding as long she continues to feel this well.  We will forward our note in this assessment to Dr. .   Jerl Santos, MD, PhD 02/05/2021, 3:02 PM [facility] [phone] or if no answer [phone]

## 2021-02-05 NOTE — Assessment & Plan Note (Signed)
She has moderate to moderately-severe obstruction by pulmonary function testing but her clinical status has always been better than is reflected by this physiology.  She does not exacerbate, does not have daily symptoms and has a fantastic functional capacity.  She is not on and has not required scheduled bronchodilators.  I think she is at low to moderate risk for general anesthesia and surgery.  There are no barriers to proceeding as long she continues to feel this well.  We will forward our note in this assessment to Dr. Jerl Santos.

## 2021-02-17 ENCOUNTER — Telehealth: Payer: Self-pay | Admitting: Emergency Medicine

## 2021-02-17 DIAGNOSIS — J449 Chronic obstructive pulmonary disease, unspecified: Secondary | ICD-10-CM

## 2021-02-17 MED ORDER — ALBUTEROL SULFATE HFA 108 (90 BASE) MCG/ACT IN AERS: 1.0000 | INHALATION_SPRAY | Freq: Four times a day (QID) | RESPIRATORY_TRACT | 1 refills | Status: DC | PRN

## 2021-02-17 NOTE — Telephone Encounter (Signed)
I have called the pt and she is aware of rx for the rescue inhaler has been sent to the pharmacy.  Nothing further is needed.

## 2021-04-10 ENCOUNTER — Telehealth: Payer: Self-pay | Admitting: Emergency Medicine

## 2021-04-10 MED ORDER — NIRMATRELVIR/RITONAVIR (PAXLOVID)TABLET: 3.0000 | ORAL_TABLET | Freq: Two times a day (BID) | ORAL | 0 refills | Status: AC

## 2021-04-10 NOTE — Telephone Encounter (Signed)
Spoke with the pt's spouse, Ronald Pippins, [place] per DPR and notified of recommendations from TP He verbalized understanding and denied any questions  Pt will call back to schedule 2-3 wk rov

## 2021-04-10 NOTE — Telephone Encounter (Signed)
History of COPD- Unvaccinated. She is on day 4 of symptoms tested positive on July 7.  Previous kidney function was normal in January. Current med list shows no current obvious contraindications  Recommend supportive care, fluids rest Tylenol as needed Quarantine at home  Recommend Paxovid for 5 days-this is an antiviral pack to help decrease progression of severe COVID. (Rx sent to pharm )  Symptoms to watch out for are severe shortness of breath low oxygen levels hemoptysis high fevers inability to eat or drink.  These would all be symptoms that she will need to seek emergency room care   Please contact office for sooner follow up if symptoms do not improve or worsen or seek emergency care    Set up ov in 2-3 weeks iin office for follow up

## 2021-04-10 NOTE — Telephone Encounter (Signed)
Has she been vaccinated for COVID-19 When she was in urgent care did they give her any type of treatment?  Day 4 of symptoms, tested + April 10 2021  Kidney function January 2022 serum creatinine 0.79, GFR greater than 60

## 2021-04-10 NOTE — Telephone Encounter (Signed)
Pt is unvaccinated. Covid test was completed at [facility] in [place], [place]. No tx was given except to take flonase as needed. Pt c/o sinus pain, face pain and teeth pain.

## 2021-04-10 NOTE — Telephone Encounter (Signed)
Primary Pulmonologist: [facility]  Last office visit and with whom: 02/05/2021, RB  What do we see them for (pulmonary problems): COPD Last OV assessment/plan: COPD (chronic obstructive pulmonary disease) (HCC) She has moderate to moderately-severe obstruction by pulmonary function testing but her clinical status has always been better than is reflected by this physiology.  She does not exacerbate, does not have daily symptoms and has a fantastic functional capacity.  She is not on and has not required scheduled bronchodilators.  I think she is at low to moderate risk for general anesthesia and surgery.  There are no barriers to proceeding as long she continues to feel this well.  We will forward our note in this assessment to Dr. Jerl Santos.  Was appointment offered to patient (explain)?  Pt tested positive for Covid today   Reason for call: Spoke with pt's husband (per DPR) who states pt has had sore throat x 3 days, headache and body ache x 2 days and sinus pressure and congestion since this morning. Husband states pt did test postivite for Covid this morning a an UC in [place]. Husband denies any F/N/V/D. Tammy could you please advise since Dr. Delton Coombes is out of office?   (examples of things to ask: : When did symptoms start? Fever? Cough? Productive? Color to sputum? More sputum than usual? Wheezing? Have you needed increased oxygen? Are you taking your respiratory medications? What over the counter measures have you tried?)  Allergies  Allergen Reactions   Cephalosporins    Latex    Morphine And Related    Other     Mycins and cyclins per patient    Tetracyclines & Related     Immunization History  Administered Date(s) Administered   Pneumococcal Conjugate-13 05/20/2017   Pneumococcal Polysaccharide-23 10/25/2018   Pneumococcal-Unspecified 05/24/2018   Tdap 03/13/2014, 06/25/2015

## 2021-04-24 ENCOUNTER — Other Ambulatory Visit: Payer: Self-pay | Admitting: Obstetrics and Gynecology

## 2021-04-24 DIAGNOSIS — R928 Other abnormal and inconclusive findings on diagnostic imaging of breast: Secondary | ICD-10-CM

## 2021-05-09 ENCOUNTER — Ambulatory Visit
Admission: RE | Admit: 2021-05-09 | Discharge: 2021-05-09 | Disposition: A | Discharge status: Home / Self Care | Payer: BC Managed Care – PPO | Source: Ambulatory Visit | Attending: Obstetrics and Gynecology | Admitting: Obstetrics and Gynecology

## 2021-05-09 ENCOUNTER — Other Ambulatory Visit: Payer: Self-pay

## 2021-05-09 DIAGNOSIS — R928 Other abnormal and inconclusive findings on diagnostic imaging of breast: Secondary | ICD-10-CM

## 2022-01-10 IMAGING — MG MM DIGITAL DIAGNOSTIC UNILAT*L* W/ TOMO W/ CAD
4 series · 4 of 12 positions shown · non-contrast
Comparison: Previous exam(s).

CLINICAL DATA: 59-year-old female for further evaluation of
possible LEFT breast mass identified on screening mammogram.

EXAM:
DIGITAL DIAGNOSTIC UNILATERAL LEFT MAMMOGRAM WITH TOMOSYNTHESIS AND
CAD; ULTRASOUND LEFT BREAST LIMITED
TECHNIQUE: Left digital diagnostic mammography and breast tomosynthesis was
performed. The images were evaluated with computer-aided detection.;
Targeted ultrasound examination of the left breast was performed.

[L MLO synth-2D]
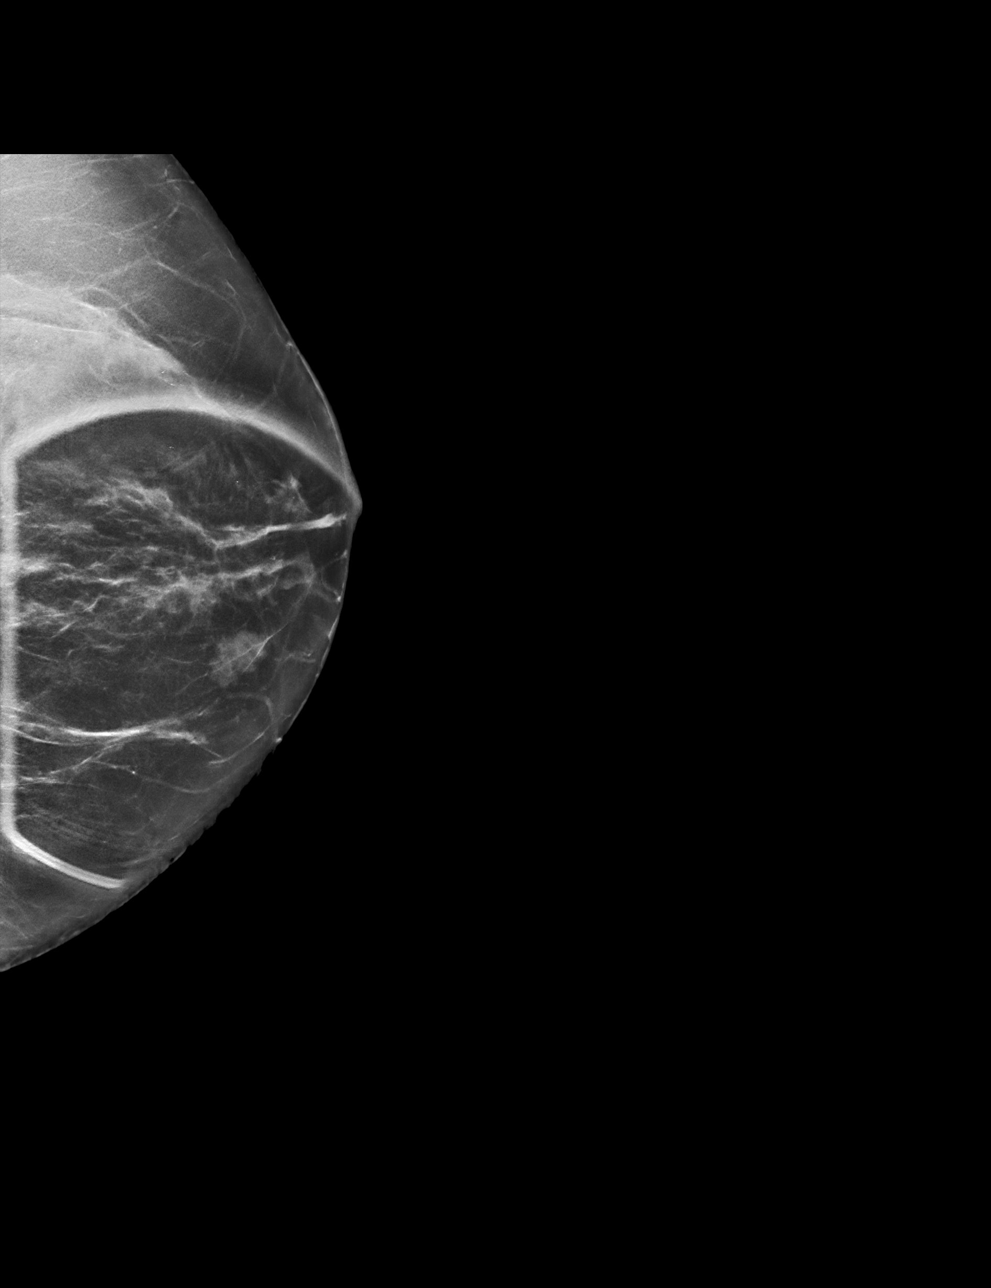

[L ML synth-2D]
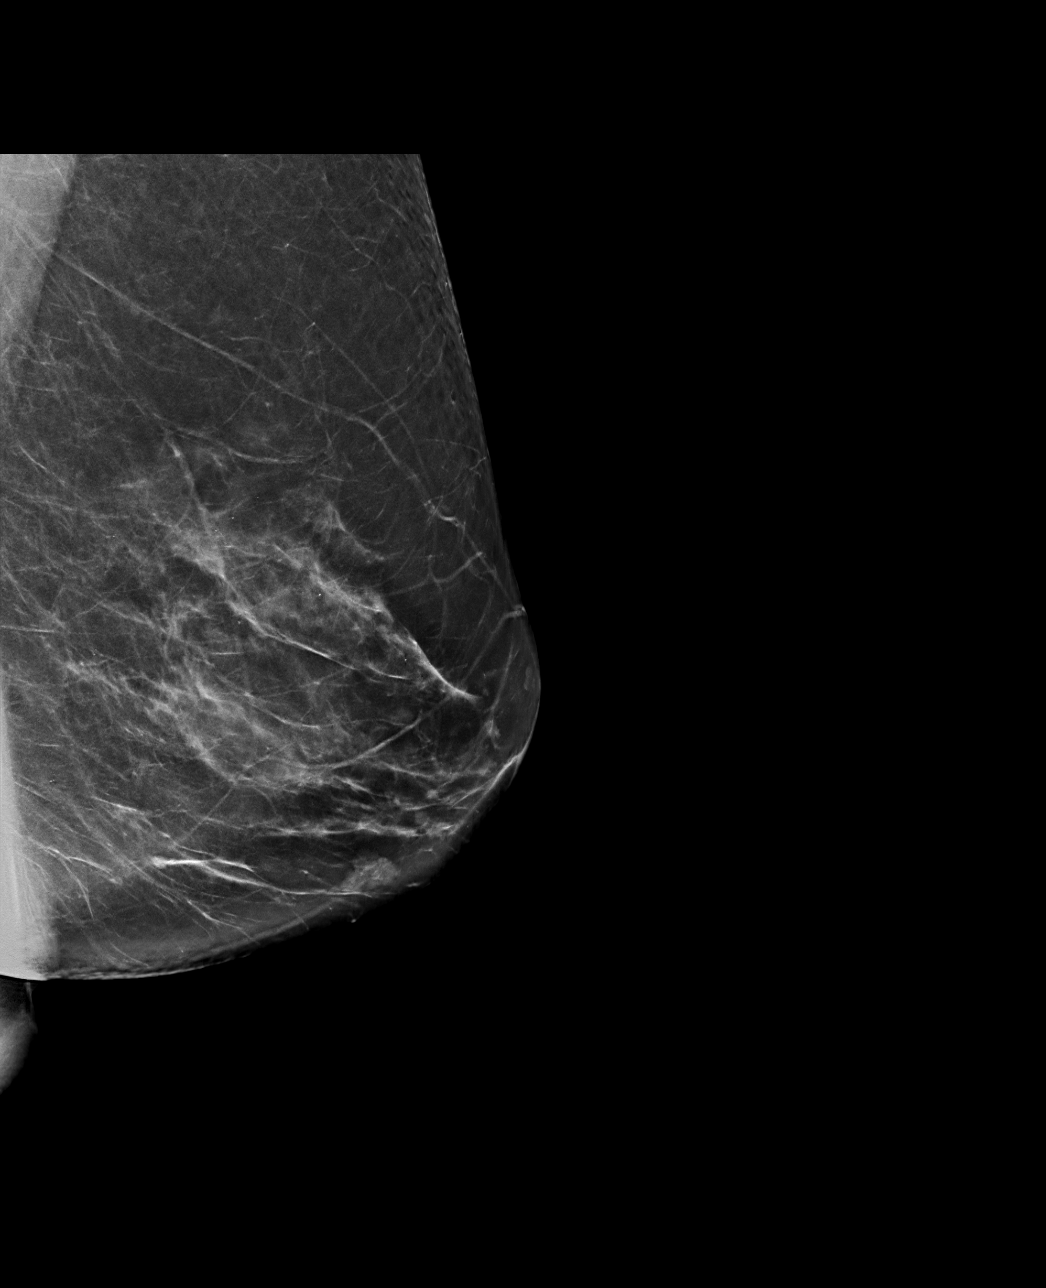

[L MLO tomo · tomo slice 30/59.0]
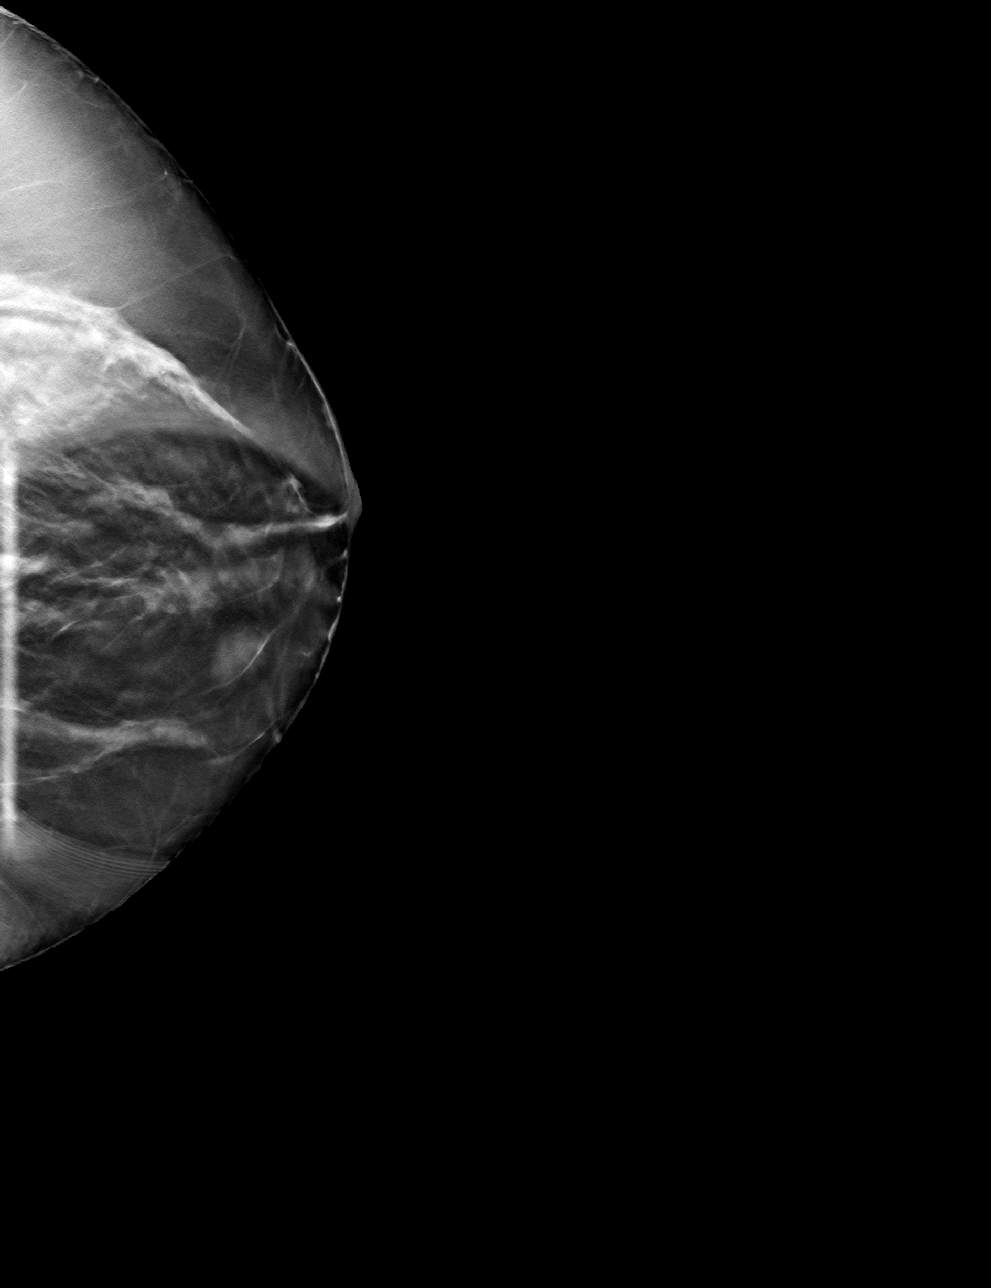

[L ML tomo · tomo slice 40/79.0]
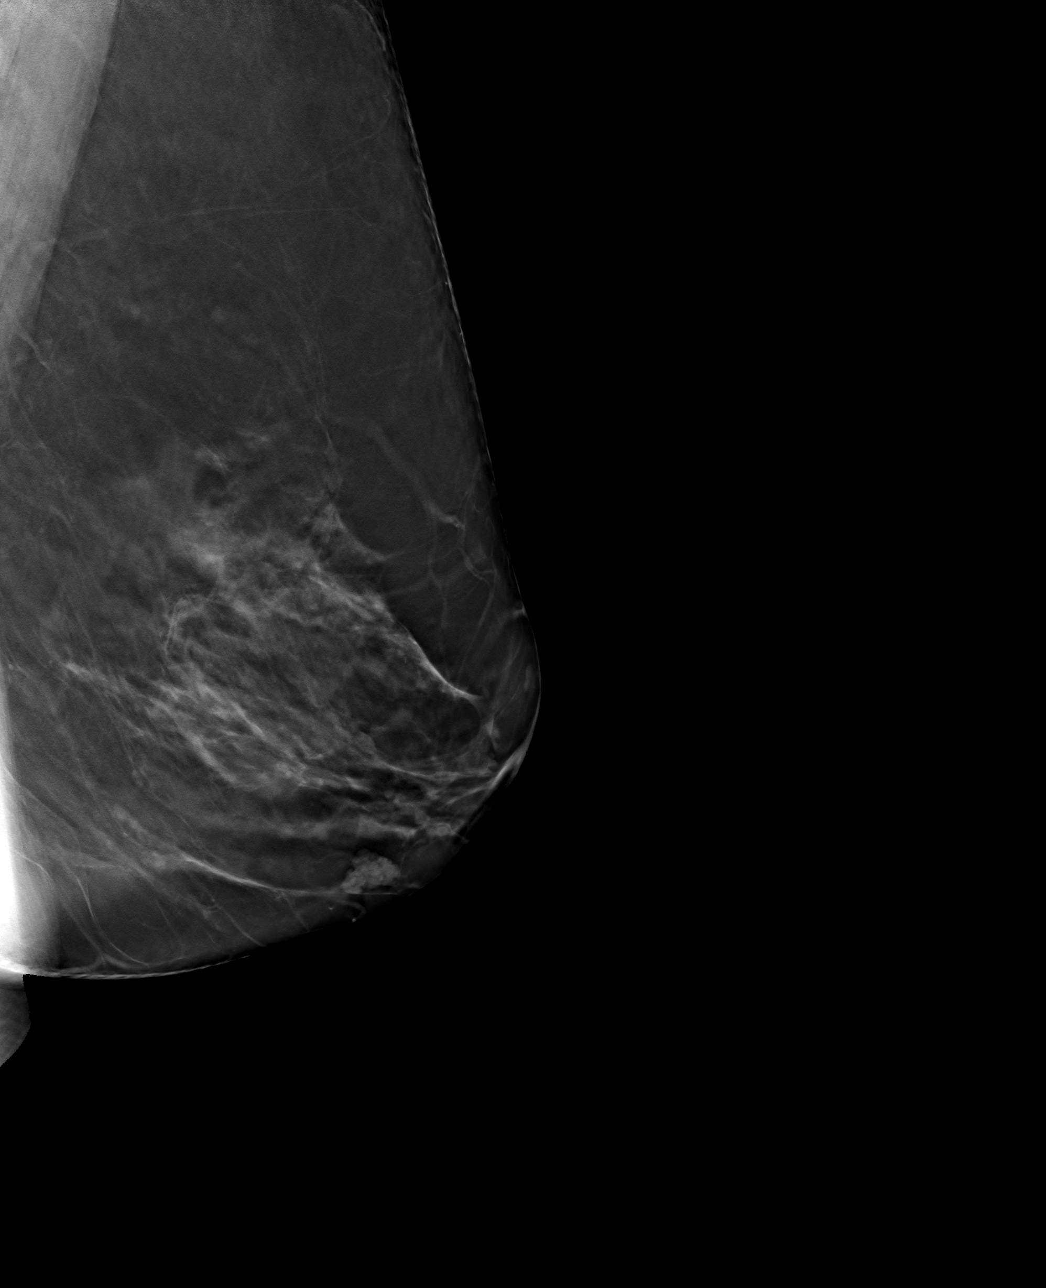

[4 of 12 positions shown; findings below may reference images not displayed]

ACR Breast Density Category c: The breast tissue is heterogeneously
dense, which may obscure small masses.
FINDINGS: 2D/3D full field and spot compression views of the LEFT breast
demonstrate a persistent circumscribed oval mass within the
superficial LOWER LEFT breast.

Targeted ultrasound is performed, showing a 0.6 x 0.5 x 1.1 cm
benign complicated cyst at the 6 o'clock position of the
RETROAREOLAR LEFT breast, corresponding to the screening study
finding.
IMPRESSION: Benign complicated cyst in the LOWER RETROAREOLAR LEFT breast
corresponding to the screening study finding.

RECOMMENDATION:
Bilateral screening mammogram in 1 year.

I have discussed the findings and recommendations with the patient.
If applicable, a reminder letter will be sent to the patient
regarding the next appointment.

BI-RADS CATEGORY  2: Benign.

## 2022-09-28 ENCOUNTER — Emergency Department (HOSPITAL_BASED_OUTPATIENT_CLINIC_OR_DEPARTMENT_OTHER): Payer: BC Managed Care – PPO

## 2022-09-28 ENCOUNTER — Emergency Department (HOSPITAL_BASED_OUTPATIENT_CLINIC_OR_DEPARTMENT_OTHER)
Admission: EM | Admit: 2022-09-28 | Discharge: 2022-09-28 | Disposition: A | Discharge status: Home / Self Care | Payer: BC Managed Care – PPO | Attending: Emergency Medicine | Admitting: Emergency Medicine

## 2022-09-28 ENCOUNTER — Other Ambulatory Visit: Payer: Self-pay

## 2022-09-28 ENCOUNTER — Encounter (HOSPITAL_BASED_OUTPATIENT_CLINIC_OR_DEPARTMENT_OTHER): Payer: Self-pay | Admitting: Emergency Medicine

## 2022-09-28 DIAGNOSIS — J209 Acute bronchitis, unspecified: Secondary | ICD-10-CM | POA: Insufficient documentation

## 2022-09-28 DIAGNOSIS — Z7982 Long term (current) use of aspirin: Secondary | ICD-10-CM | POA: Diagnosis not present

## 2022-09-28 DIAGNOSIS — R059 Cough, unspecified: Secondary | ICD-10-CM | POA: Diagnosis present

## 2022-09-28 DIAGNOSIS — Z87891 Personal history of nicotine dependence: Secondary | ICD-10-CM | POA: Diagnosis not present

## 2022-09-28 DIAGNOSIS — Z1152 Encounter for screening for COVID-19: Secondary | ICD-10-CM | POA: Insufficient documentation

## 2022-09-28 DIAGNOSIS — J449 Chronic obstructive pulmonary disease, unspecified: Secondary | ICD-10-CM | POA: Diagnosis not present

## 2022-09-28 DIAGNOSIS — R911 Solitary pulmonary nodule: Secondary | ICD-10-CM | POA: Diagnosis not present

## 2022-09-28 LAB — RESP PANEL BY RT-PCR (RSV, FLU A&B, COVID)  RVPGX2
Influenza A by PCR: NEGATIVE
Influenza B by PCR: NEGATIVE
Resp Syncytial Virus by PCR: NEGATIVE
SARS Coronavirus 2 by RT PCR: NEGATIVE

## 2022-09-28 MED ORDER — AMOXICILLIN-POT CLAVULANATE 875-125 MG PO TABS: 1.0000 | ORAL_TABLET | Freq: Two times a day (BID) | ORAL | 0 refills | Status: DC

## 2022-09-28 NOTE — ED Notes (Signed)
Pt discharged to home. Discharge instructions have been discussed with patient and/or family members. Pt verbally acknowledges understanding d/c instructions, and endorses comprehension to checkout at registration before leaving.  °

## 2022-09-28 NOTE — ED Triage Notes (Signed)
Pt arrive spov, c/o facial pain and swelling with cough and congestion starting yesterday. Ibuprofen today

## 2022-09-28 NOTE — ED Provider Notes (Signed)
[facility] EMERGENCY DEPARTMENT Provider Note   CSN: [id number] Arrival date & time: 09/28/22  1128     History {Add pertinent medical, surgical, social history, OB history to HPI:1} Chief Complaint  Patient presents with   Cough    Deborah May is a 61 y.o. female.  She is a former smoker, has a history of COPD.  She started having tightness in her chest and cough started yesterday.  Today she said when she woke up her face felt swollen and she was having a lot of nasal congestion and sinus pressure.  Low-grade fever.  Cough is nonproductive.  No nausea vomiting diarrhea.  The history is provided by the patient.  Cough Cough characteristics:  Non-productive Sputum characteristics:  Nondescript Severity:  Moderate Onset quality:  Gradual Duration:  2 days Timing:  Intermittent Progression:  Unchanged Chronicity:  New Relieved by:  Nothing Worsened by:  Nothing Ineffective treatments:  None tried Associated symptoms: chills, fever, rhinorrhea, shortness of breath and sinus congestion        Home Medications Prior to Admission medications   Medication Sig Start Date End Date Taking? Authorizing Provider  albuterol (PROVENTIL HFA) 108 (90 Base) MCG/ACT inhaler Inhale 1 puff into the lungs every 6 (six) hours as needed for wheezing or shortness of breath. 02/17/21   Leslye Peer, MD  APPLE CIDER VINEGAR PO Take by mouth.    [provider]  aspirin EC 81 MG tablet Take 81 mg by mouth daily.    [provider]  Cholecalciferol (VITAMIN D3) 50 MCG (2000 UT) capsule Take 2,000 Units by mouth daily.    [provider]  clobetasol ointment (TEMOVATE) 0.05 % daily as needed. 10/12/19   [provider]  ELDERBERRY PO Take by mouth.    [provider]  GARLIC PO Take by mouth.    [provider]  Ginger, Zingiber officinalis, (GINGER PO) Take by mouth.    [provider]  loratadine (CLARITIN) 10 MG tablet  Take 10 mg by mouth daily.    [provider]  NON FORMULARY Take 500 mg by mouth daily.    [provider]  Turmeric (QC TUMERIC COMPLEX PO) Take by mouth.    [provider]  vitamin B-12 (CYANOCOBALAMIN) 1000 MCG tablet Take 1,000 mcg by mouth daily.    [provider]  Zinc 22.5 MG TABS Take 1 tablet by mouth every other day.    [provider]      Allergies    Cephalosporins, Latex, Morphine and related, Other, and Tetracyclines & related    Review of Systems   Review of Systems  Constitutional:  Positive for chills and fever.  HENT:  Positive for rhinorrhea.   Respiratory:  Positive for cough and shortness of breath.     Physical Exam Updated Vital Signs BP (!) 145/77   Pulse 78   Temp 98.1 F (36.7 C) (Oral)   Resp 18   Ht 5\' 3"  (1.6 m)   Wt 72.6 kg   SpO2 94%   BMI 28.34 kg/m  Physical Exam Vitals and nursing note reviewed.  Constitutional:      General: She is not in acute distress.    Appearance: Normal appearance. She is well-developed.  HENT:     Head: Normocephalic and atraumatic.     Right Ear: Tympanic membrane normal.     Left Ear: Tympanic membrane normal.     Nose: Rhinorrhea present.  Mouth/Throat:     Mouth: Mucous membranes are moist.     Pharynx: Oropharynx is clear.  Eyes:     Conjunctiva/sclera: Conjunctivae normal.  Cardiovascular:     Rate and Rhythm: Normal rate and regular rhythm.     Heart sounds: No murmur heard. Pulmonary:     Effort: Pulmonary effort is normal. No respiratory distress.     Breath sounds: Normal breath sounds.  Abdominal:     Palpations: Abdomen is soft.     Tenderness: There is no abdominal tenderness. There is no guarding or rebound.  Musculoskeletal:        General: No swelling.     Cervical back: Neck supple.     Right lower leg: No edema.     Left lower leg: No edema.  Skin:    General: Skin is warm and dry.     Capillary Refill: Capillary refill takes less  than 2 seconds.  Neurological:     General: No focal deficit present.     Mental Status: She is alert.     ED Results / Procedures / Treatments   Labs (all labs ordered are listed, but only abnormal results are displayed) Labs Reviewed  RESP PANEL BY RT-PCR (RSV, FLU A&B, COVID)  [id number]    EKG None  Radiology No results found.  Procedures Procedures  {Document cardiac monitor, telemetry assessment procedure when appropriate:1}  Medications Ordered in ED Medications - No data to display  ED Course/ Medical Decision Making/ A&P                           Medical Decision Making Amount and/or Complexity of Data Reviewed Radiology: ordered.   This patient complains of ***; this involves an extensive number of treatment Options and is a complaint that carries with it a high risk of complications and morbidity. The differential includes ***  I ordered, reviewed and interpreted labs, which included *** I ordered medication *** and reviewed PMP when indicated. I ordered imaging studies which included *** and I independently    visualized and interpreted imaging which showed *** Additional history obtained from *** Previous records obtained and reviewed *** I consulted *** and discussed lab and imaging findings and discussed disposition.  Cardiac monitoring reviewed, *** Social determinants considered, *** Critical Interventions: ***  After the interventions stated above, I reevaluated the patient and found *** Admission and further testing considered, ***   {Document critical care time when appropriate:1} {Document review of labs and clinical decision tools ie heart score, Chads2Vasc2 etc:1}  {Document your independent review of radiology images, and any outside records:1} {Document your discussion with family members, caretakers, and with consultants:1} {Document social determinants of health affecting pt's care:1} {Document your decision making why or why not  admission, treatments were needed:1} Final Clinical Impression(s) / ED Diagnoses Final diagnoses:  None    Rx / DC Orders ED Discharge Orders     None

## 2022-09-28 NOTE — Discharge Instructions (Addendum)
You were seen in the emergency department for cough shortness of breath sinus congestion.  Your COVID and flu test were negative.  Your chest x-ray did not show any pneumonia but did show a lung nodule that will need follow-up with your primary care doctor.  We are starting you on antibiotics.  Continue to use your inhaler and drink plenty of fluids.  Follow-up with your primary care doctor.

## 2022-12-22 ENCOUNTER — Other Ambulatory Visit: Payer: Self-pay | Admitting: *Deleted

## 2022-12-22 ENCOUNTER — Encounter: Payer: Self-pay | Admitting: Emergency Medicine

## 2022-12-22 ENCOUNTER — Ambulatory Visit (INDEPENDENT_AMBULATORY_CARE_PROVIDER_SITE_OTHER): Payer: BC Managed Care – PPO | Admitting: Emergency Medicine

## 2022-12-22 VITALS — BP 120/68 | HR 80 | Temp 98.1°F | Ht 63.0 in | Wt 165.8 lb

## 2022-12-22 DIAGNOSIS — J449 Chronic obstructive pulmonary disease, unspecified: Secondary | ICD-10-CM

## 2022-12-22 DIAGNOSIS — R911 Solitary pulmonary nodule: Secondary | ICD-10-CM

## 2022-12-22 MED ORDER — ALBUTEROL SULFATE HFA 108 (90 BASE) MCG/ACT IN AERS: 1.0000 | INHALATION_SPRAY | Freq: Four times a day (QID) | RESPIRATORY_TRACT | 3 refills | Status: DC | PRN

## 2022-12-22 NOTE — Assessment & Plan Note (Signed)
Question right lower lobe pulmonary nodule noted on her chest x-ray 09/2022.  We will perform a dedicated CT chest without contrast (contrast allergy) to evaluate further.  We will plan further evaluation, CTs, diagnostics depending on that result.

## 2022-12-22 NOTE — Patient Instructions (Addendum)
Please continue to keep your albuterol available to use 2 puffs if needed for shortness of breath, chest tightness, wheezing.  You could also pretreat exercise or singing with 2 puffs We will and guaifenesin 600 mg once daily (generic Mucinex).  Take this with a lot of water We will hold off on starting an every day scheduled inhaler medication at this time We will perform a CT scan of your chest without contrast Follow Dr. Lamonte Sakai next available after your CT chest so we can review the results together.

## 2022-12-22 NOTE — Assessment & Plan Note (Signed)
Continues to only have mild symptoms.  Rare albuterol use.  We will refill this for her today.  Good exertional tolerance.  She does pretreat exercise at times, singing.  She does have some thick mucus that she has to clear daily.  We will try adding guaifenesin.  Consider long-acting medication at some point going forward depending on symptom burden.

## 2022-12-22 NOTE — Addendum Note (Signed)
Addended by: Gavin Potters R on: 12/22/2022 10:35 AM   Modules accepted: Orders

## 2022-12-22 NOTE — Progress Notes (Signed)
Subjective:    Patient ID: Deborah May, female    DOB: 04-Feb-1961, 62 y.o.   MRN: [id number]  HPI  ROV 12/22/22 --[name] is a 62, former smoker with COPD, allergic rhinitis on loratadine.  She also had COVID-19 in 07/2019.  Good functional capacity in the past so she has not been on maintenance therapy.  She is here for follow-up.  States that she had an upper respiratory infection with associated cough in December 2023.  She was seen in urgent care, had a chest x-ray at that time as below.  Today she reports that she is back to baseline, has good functional capacity. Rarely needs her albuterol - does use it prior to her singing. Intermittent cough and daily mucous production - difficult to get from her chest. Rarely needs to use albuterol.   Chest x-ray 09/28/2022 reviewed by me shows no overt infiltrates, some aortic calcifications, a 1.8 cm nodular density at the right lung base and a subcentimeter nodular left apical density of unclear significance.     Review of Systems  Constitutional:  Negative for fever and unexpected weight change.  HENT:  Negative for congestion, dental problem, ear pain, nosebleeds, postnasal drip, rhinorrhea, sinus pressure, sneezing, sore throat and trouble swallowing.   Eyes:  Negative for redness and itching.  Respiratory:  Positive for cough and shortness of breath. Negative for chest tightness and wheezing.   Cardiovascular:  Negative for palpitations and leg swelling.  Gastrointestinal:  Negative for nausea and vomiting.  Genitourinary:  Negative for dysuria.  Musculoskeletal:  Negative for joint swelling.  Skin:  Negative for rash.  Neurological:  Negative for headaches.  Hematological:  Does not bruise/bleed easily.  Psychiatric/Behavioral:  Negative for dysphoric mood. The patient is not nervous/anxious.      Allergies  Allergen Reactions   Cephalosporins    Latex    Morphine And Related    Other     Mycins and cyclins per patient     Tetracyclines & Related      Outpatient Medications Prior to Visit  Medication Sig Dispense Refill   albuterol (PROVENTIL HFA) 108 (90 Base) MCG/ACT inhaler Inhale 1 puff into the lungs every 6 (six) hours as needed for wheezing or shortness of breath. 18 g 1   APPLE CIDER VINEGAR PO Take by mouth.     aspirin EC 81 MG tablet Take 81 mg by mouth daily.     Cholecalciferol (VITAMIN D3) 50 MCG (2000 UT) capsule Take 2,000 Units by mouth daily.     clobetasol ointment (TEMOVATE) 0.05 % daily as needed.     ELDERBERRY PO Take by mouth.     GARLIC PO Take by mouth.     Ginger, Zingiber officinalis, (GINGER PO) Take by mouth.     loratadine (CLARITIN) 10 MG tablet Take 10 mg by mouth daily. Pt takes every other day     NON FORMULARY Take 500 mg by mouth daily.     Turmeric (QC TUMERIC COMPLEX PO) Take by mouth.     Zinc 22.5 MG TABS Take 1 tablet by mouth every other day.     amoxicillin-clavulanate (AUGMENTIN) 875-125 MG tablet Take 1 tablet by mouth every 12 (twelve) hours. 14 tablet 0   vitamin B-12 (CYANOCOBALAMIN) 1000 MCG tablet Take 1,000 mcg by mouth daily.     No facility-administered medications prior to visit.        Objective:    Vitals:   12/22/22 0922  BP: 120/68  Pulse: 80  Temp: 98.1 F (36.7 C)  TempSrc: Oral  SpO2: 98%  Weight: 165 lb 12.8 oz (75.2 kg)  Height: 5\' 3"  (1.6 m)   Gen: Pleasant, well-nourished, in no distress,  normal affect  ENT: No lesions,  mouth clear,  oropharynx clear, no postnasal drip  Neck: No JVD, no stridor  Lungs: No use of accessory muscles, no crackles or wheezing on normal respiration, no wheeze on forced expiration  Cardiovascular: RRR, heart sounds normal, no murmur or gallops, no peripheral edema  Musculoskeletal: No deformities, no cyanosis or clubbing  Neuro: alert, awake, non focal  Skin: Warm, no lesions or rash     Assessment & Plan:  COPD (chronic obstructive pulmonary disease) (HCC) Continues to only have mild  symptoms.  Rare albuterol use.  We will refill this for her today.  Good exertional tolerance.  She does pretreat exercise at times, singing.  She does have some thick mucus that she has to clear daily.  We will try adding guaifenesin.  Consider long-acting medication at some point going forward depending on symptom burden.  Pulmonary nodule Question right lower lobe pulmonary nodule noted on her chest x-ray 09/2022.  We will perform a dedicated CT chest without contrast (contrast allergy) to evaluate further.  We will plan further evaluation, CTs, diagnostics depending on that result.   Baltazar Apo, MD, PhD 12/22/2022, 9:42 AM [facility] [phone] or if no answer [phone]

## 2023-01-15 ENCOUNTER — Telehealth: Payer: Self-pay | Admitting: Emergency Medicine

## 2023-01-15 ENCOUNTER — Other Ambulatory Visit: Payer: Self-pay

## 2023-01-15 MED ORDER — AMOXICILLIN 500 MG PO CAPS: 500.0000 mg | ORAL_CAPSULE | Freq: Two times a day (BID) | ORAL | 0 refills | Status: DC

## 2023-01-15 MED ORDER — PREDNISONE 10 MG PO TABS: ORAL_TABLET | ORAL | 0 refills | Status: AC

## 2023-01-15 NOTE — Telephone Encounter (Signed)
Called and spoke with pt who stated two days ago she started having complaints of coughing, wheezing and also has been having mucus production.   Pt said she was outside raking leaves in the pollen. Stated that she first started developing a headache and sore throat which she believes was due to her being outside. Pt is currently at [facility] until Sunday 4/14.   She said when she coughs, it burns. Pt has been using her rescue inhaler due to hearing the wheezing. States she has used her rescue inhaler about 3 times daily due to the wheezing. When she coughs, she is getting up phlegm that is yellow to green in color.  Pt has been taking mucus relief OTC to see if that would help with her symptoms.   Before symptoms got any worse, pt wanted to know if meds could be prescribed. Dr. Delton Coombes, please advise.

## 2023-01-15 NOTE — Telephone Encounter (Signed)
Change to doxycycline 100mg  bid x 7 days Change pred to 20mg  qd x 5 days, then 10mg  x 10 days, then stop

## 2023-01-15 NOTE — Telephone Encounter (Signed)
Will treat her for an acute exacerbation.  If she worsens then she needs to go be seen by urgent care while she is out of town. Please prescribe prednisone > Take 40mg  daily for 3 days, then 30mg  daily for 3 days, then 20mg  daily for 3 days, then 10mg  daily for 3 days, then stop Azithromycin > Z-pack

## 2023-01-15 NOTE — Telephone Encounter (Signed)
Patient states having symptoms of wheezing, cough and mucus. Pharmacy is [facility] [place]. Phone number is [phone]. Allergic to several different antioboitcs. Patient phone number is [phone].

## 2023-01-15 NOTE — Telephone Encounter (Signed)
Called and spoke with pt letting her know the recs per Dr. Delton Coombes. When stated to pt that he wanted her to be prescribed zpak, pt said that she is allergic to azithromycin and cannot take that.    Pt said abx that she is okay taking is amoxicillin or augmentin.  Also when discussing the prednisone taper that RB wanted to put her on, pt said that if she is on a higher dose of prednisone, it usually causes her to have heart palpitations if the dose is higher than 20mg .  Dr. Delton Coombes, please advise on this for med change or if you want pt to just go ahead and go to [facility] for a visit due to multiple allergies to various abx?

## 2023-01-15 NOTE — Telephone Encounter (Signed)
Please have her take amoxicillin 500mg  bid x 7 days

## 2023-01-15 NOTE — Telephone Encounter (Signed)
Spoke with patient. Verified pharmacy. Amoxicillin has been sent to pharmacy. NFN

## 2023-01-18 ENCOUNTER — Ambulatory Visit
Admission: RE | Admit: 2023-01-18 | Discharge: 2023-01-18 | Disposition: A | Discharge status: Home / Self Care | Payer: BC Managed Care – PPO | Source: Ambulatory Visit | Attending: Emergency Medicine | Admitting: Emergency Medicine

## 2023-01-18 DIAGNOSIS — R911 Solitary pulmonary nodule: Secondary | ICD-10-CM

## 2023-01-20 ENCOUNTER — Encounter: Payer: Self-pay | Admitting: Emergency Medicine

## 2023-01-20 ENCOUNTER — Ambulatory Visit (INDEPENDENT_AMBULATORY_CARE_PROVIDER_SITE_OTHER): Payer: BC Managed Care – PPO | Admitting: Emergency Medicine

## 2023-01-20 ENCOUNTER — Telehealth: Payer: Self-pay | Admitting: Emergency Medicine

## 2023-01-20 VITALS — BP 136/72 | HR 71 | Temp 98.0°F | Ht 63.0 in | Wt 164.2 lb

## 2023-01-20 DIAGNOSIS — R911 Solitary pulmonary nodule: Secondary | ICD-10-CM | POA: Diagnosis not present

## 2023-01-20 DIAGNOSIS — J449 Chronic obstructive pulmonary disease, unspecified: Secondary | ICD-10-CM

## 2023-01-20 DIAGNOSIS — Z720 Tobacco use: Secondary | ICD-10-CM

## 2023-01-20 NOTE — Assessment & Plan Note (Signed)
Reviewed her CT chest today.  The pulmonary nodule in question likely represents an area of linear scar that is slightly thicker at the pleural surface in the right lower lobe.  There is also some right middle lobe proximal airways inflammation and mild bronchiectasis.  I do not see a discrete nodule that would be concerning for malignancy.  I would like to follow this I think she would be a good candidate to do so while participating in the lung cancer screening program.  We discussed this today with a shared decision-making visit and she is willing to proceed.

## 2023-01-20 NOTE — Progress Notes (Addendum)
Subjective:    Patient ID: Deborah May, female    DOB: 15-Jul-1961, 62 y.o.   MRN: [id number]  HPI  ROV 12/22/22 --Deborah May is a 22, former smoker with COPD, allergic rhinitis on loratadine.  She also had COVID-19 in 07/2019.  Good functional capacity in the past so she has not been on maintenance therapy.  She is here for follow-up.  States that she had an upper respiratory infection with associated cough in December 2023.  She was seen in urgent care, had a chest x-ray at that time as below.  Today she reports that she is back to baseline, has good functional capacity. Rarely needs her albuterol - does use it prior to her singing. Intermittent cough and daily mucous production - difficult to get from her chest. Rarely needs to use albuterol.   Chest x-ray 09/28/2022 reviewed by me shows no overt infiltrates, some aortic calcifications, a 1.8 cm nodular density at the right lung base and a subcentimeter nodular left apical density of unclear significance.   ROV 01/20/23 --62 year old woman, former smoker with mild COPD who has not been on maintenance medications.  She had a chest x-ray in December 2023 that showed a questionable right lower lobe nodular density.  We performed a CT chest to further evaluate as below.  Since I last saw her she had flaring bronchitic symptoms, wheezing.  I treated her with prednisone and amoxicillin, finishing now.  Today she reports that she is better - still has some hoarse voice and cough. Not on loratadine right now. Used albuterol while she was ill, not since.   We are obstruction on pulmonary function testing from 2020  CT chest 01/18/2023 reviewed by me, shows emphysematous change bilaterally, mostly superiorly.  There is some subtle linear scar with more nodular focus at the right base.  Some bronchiectasis and airway thickening in the proximal anterior middle lobe.  No discrete nodules   Review of Systems  Constitutional:  Negative for fever and unexpected  weight change.  HENT:  Negative for congestion, dental problem, ear pain, nosebleeds, postnasal drip, rhinorrhea, sinus pressure, sneezing, sore throat and trouble swallowing.   Eyes:  Negative for redness and itching.  Respiratory:  Positive for cough and shortness of breath. Negative for chest tightness and wheezing.   Cardiovascular:  Negative for palpitations and leg swelling.  Gastrointestinal:  Negative for nausea and vomiting.  Genitourinary:  Negative for dysuria.  Musculoskeletal:  Negative for joint swelling.  Skin:  Negative for rash.  Neurological:  Negative for headaches.  Hematological:  Does not bruise/bleed easily.  Psychiatric/Behavioral:  Negative for dysphoric mood. The patient is not nervous/anxious.      Allergies  Allergen Reactions   Cephalosporins    Latex    Morphine And Related    Other     Mycins and cyclins per patient    Tetracyclines & Related      Outpatient Medications Prior to Visit  Medication Sig Dispense Refill   albuterol (PROVENTIL HFA) 108 (90 Base) MCG/ACT inhaler Inhale 1 puff into the lungs every 6 (six) hours as needed for wheezing or shortness of breath. 18 g 3   amoxicillin (AMOXIL) 500 MG capsule Take 1 capsule (500 mg total) by mouth 2 (two) times daily. 14 capsule 0   APPLE CIDER VINEGAR PO Take by mouth.     aspirin EC 81 MG tablet Take 81 mg by mouth daily.     Cholecalciferol (VITAMIN D3) 50 MCG (2000 UT) capsule  Take 2,000 Units by mouth daily.     clobetasol ointment (TEMOVATE) 0.05 % daily as needed.     ELDERBERRY PO Take by mouth.     GARLIC PO Take by mouth.     Ginger, Zingiber officinalis, (GINGER PO) Take by mouth.     NON FORMULARY Take 500 mg by mouth daily.     predniSONE (DELTASONE) 10 MG tablet Take 2 tablets (20 mg total) by mouth daily with breakfast for 10 days, THEN 1 tablet (10 mg total) daily with breakfast for 10 days. 30 tablet 0   Turmeric (QC TUMERIC COMPLEX PO) Take by mouth.     Zinc 22.5 MG TABS Take  1 tablet by mouth every other day.     amoxicillin-clavulanate (AUGMENTIN) 875-125 MG tablet Take 1 tablet by mouth every 12 (twelve) hours. 14 tablet 0   loratadine (CLARITIN) 10 MG tablet Take 10 mg by mouth daily. Pt takes every other day (Patient not taking: Reported on 01/20/2023)     vitamin B-12 (CYANOCOBALAMIN) 1000 MCG tablet Take 1,000 mcg by mouth daily.     No facility-administered medications prior to visit.        Objective:    Vitals:   01/20/23 1001  BP: 136/72  Pulse: 71  Temp: 98 F (36.7 C)  TempSrc: Oral  SpO2: 96%  Weight: 164 lb 3.2 oz (74.5 kg)  Height:  (1.6 m)    Gen: Pleasant, well-nourished, in no distress,  normal affect  ENT: No lesions,  mouth clear,  oropharynx clear, no postnasal drip  Neck: No JVD, no stridor  Lungs: No use of accessory muscles, no crackles or wheezing on normal respiration, no wheeze on forced expiration  Cardiovascular: RRR, heart sounds normal, no murmur or gallops, no peripheral edema  Musculoskeletal: No deformities, no cyanosis or clubbing  Neuro: alert, awake, non focal  Skin: Warm, no lesions or rash     Assessment & Plan:  COPD (chronic obstructive pulmonary disease) (HCC) Moderately severe obstruction on her pulmonary function testing.  Good functional capacity, rarely needs albuterol.  She is not on scheduled BD therapy.  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  Suspect that she will need to be on scheduled BD at some point in the future.  Will base this on her clinical status.  Recent exacerbation is improving after prednisone and amoxicillin  She is planning to undergo colonoscopy with Dr. Dulce Sellar.  Her obstructive lung disease places her at low to moderate risk for anesthesia.  There is no contraindication to proceeding with the colonoscopy as long as she is not experiencing flaring symptoms such as dyspnea, cough, wheezing, etc.  Pulmonary nodule Reviewed her CT chest today.  The pulmonary nodule in  question likely represents an area of linear scar that is slightly thicker at the pleural surface in the right lower lobe.  There is also some right middle lobe proximal airways inflammation and mild bronchiectasis.  I do not see a discrete nodule that would be concerning for malignancy.  I would like to follow this I think she would be a good candidate to do so while participating in the lung cancer screening program.  We discussed this today with a shared decision-making visit and she is willing to proceed.   Levy Pupa, MD, PhD 01/20/2023, 11:02 AM  [facility] [phone] or if no answer [phone]

## 2023-01-20 NOTE — Telephone Encounter (Signed)
OV notes and clearance form have been faxed back to Dr. Dulce Sellar with [facility]. Nothing further needed at this time.

## 2023-01-20 NOTE — Assessment & Plan Note (Addendum)
Moderately severe obstruction on her pulmonary function testing.  Good functional capacity, rarely needs albuterol.  She is not on scheduled BD therapy.  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  Suspect that she will need to be on scheduled BD at some point in the future.  Will base this on her clinical status.  Recent exacerbation is improving after prednisone and amoxicillin  She is planning to undergo colonoscopy with Dr. Dulce Sellar.  Her obstructive lung disease places her at low to moderate risk for anesthesia.  There is no contraindication to proceeding with the colonoscopy as long as she is not experiencing flaring symptoms such as dyspnea, cough, wheezing, etc.

## 2023-01-20 NOTE — Patient Instructions (Addendum)
We reviewed your CT scan of the chest today. We will enroll you in participation in the lung cancer screening program.  Your first scan will be in April 2025. Keep albuterol available to use 2 puffs if needed for shortness of breath, chest tightness, wheezing. We may decide to consider starting an every day inhaler medication at some point in the future. Follow with Dr. Delton Coombes in April 2025 after your CT to review, sooner if you have any problems.

## 2023-01-20 NOTE — Telephone Encounter (Signed)
I signed an addendum to her note today 01/20/2023 to indicate that she is low to moderate risk

## 2023-01-20 NOTE — Telephone Encounter (Signed)
Fax received from Dr. Willis Modena with [facility] to perform a Colonoscopy on patient.  Patient needs surgery clearance. Surgery is Pending. Patient was seen on 12/22/2022. Office protocol is a risk assessment can be sent to surgeon if patient has been seen in 60 days or less.   Sending to Dr. Delton Coombes for risk assessment or recommendations if patient needs to be seen in office prior to surgical procedure.

## 2023-01-20 NOTE — Addendum Note (Signed)
Addended by: Dorisann Frames R on: 01/20/2023 10:55 AM   Modules accepted: Orders

## 2023-03-02 ENCOUNTER — Telehealth: Payer: Self-pay | Admitting: Emergency Medicine

## 2023-03-02 NOTE — Telephone Encounter (Signed)
PT is having a Colonoscopy 6/27 and wondered about SX clearance. Should she notify us or will the other Dr. do so?  Adv her typically they will send a fax asking for that clearance but I would let our team know. Please call to advise PT @ [phone]

## 2023-03-04 NOTE — Telephone Encounter (Signed)
Spoke with patient. Advised surgical clearance for coloscopy has been completed and sent back to [facility]. NFN

## 2023-05-31 ENCOUNTER — Ambulatory Visit: Payer: BC Managed Care – PPO | Admitting: Nurse Practitioner

## 2023-05-31 ENCOUNTER — Encounter: Payer: Self-pay | Admitting: Nurse Practitioner

## 2023-05-31 VITALS — BP 120/88 | HR 81 | Temp 97.8°F | Ht 63.0 in | Wt 160.0 lb

## 2023-05-31 DIAGNOSIS — J449 Chronic obstructive pulmonary disease, unspecified: Secondary | ICD-10-CM | POA: Diagnosis not present

## 2023-05-31 DIAGNOSIS — F17201 Nicotine dependence, unspecified, in remission: Secondary | ICD-10-CM

## 2023-05-31 DIAGNOSIS — J441 Chronic obstructive pulmonary disease with (acute) exacerbation: Secondary | ICD-10-CM

## 2023-05-31 MED ORDER — BENZONATATE 200 MG PO CAPS: 200.0000 mg | ORAL_CAPSULE | Freq: Three times a day (TID) | ORAL | 1 refills | Status: DC | PRN

## 2023-05-31 MED ORDER — PREDNISONE 20 MG PO TABS: 40.0000 mg | ORAL_TABLET | Freq: Every day | ORAL | 0 refills | Status: DC

## 2023-05-31 MED ORDER — PREDNISONE 20 MG PO TABS: ORAL_TABLET | ORAL | 0 refills | Status: DC

## 2023-05-31 MED ORDER — STIOLTO RESPIMAT 2.5-2.5 MCG/ACT IN AERS: 2.0000 | INHALATION_SPRAY | Freq: Every day | RESPIRATORY_TRACT | 5 refills | Status: DC

## 2023-05-31 MED ORDER — STIOLTO RESPIMAT 2.5-2.5 MCG/ACT IN AERS: 2.0000 | INHALATION_SPRAY | Freq: Every day | RESPIRATORY_TRACT | Status: DC

## 2023-05-31 MED ORDER — ALBUTEROL SULFATE HFA 108 (90 BASE) MCG/ACT IN AERS: 1.0000 | INHALATION_SPRAY | Freq: Four times a day (QID) | RESPIRATORY_TRACT | 3 refills | Status: DC | PRN

## 2023-05-31 NOTE — Assessment & Plan Note (Signed)
AECOPD. Some clinical improvement but still with increased dyspnea and cough from baseline. She has bronchospasm on exam today. We will treat her with prednisone taper. She is unable to tolerate higher doses so will start her at 20 mg and taper down. Hold off on abx given improvement and lack of infectious symptoms. Will obtain imaging and empiric abx if symptoms do not improve or worsen. Action plan in place. This is her second exacerbation within 6 months. Recommend she start maintenance therapy with LAMA/LABA. She was agreeable to this. Medication education provided and side effect profile reviewed. Teachback performed. Reviewed role of maintenance vs rescue.   Patient Instructions  Continue Albuterol inhaler 2 puffs or 3 mL neb every 6 hours as needed for shortness of breath or wheezing. Notify if symptoms persist despite rescue inhaler/neb use.  Start Stiolto 2 puffs daily. This is your new maintenance inhaler that you will use daily regardless of how your breathing is. It is not for emergencies/rescue purposes Prednisone 40 mg daily for 5 days. Take in AM with food Mucinex DM Twice daily for cough/congestion Benzonatate 1 capsule Three times a day as needed for cough  Follow up in 4-5 weeks with Dr. Delton Coombes or Katie Lulubelle Simcoe,NP. If symptoms do not improve or worsen, please contact office for sooner follow up or seek emergency care.

## 2023-05-31 NOTE — Assessment & Plan Note (Signed)
 Referred to lung cancer screening program at last OV

## 2023-05-31 NOTE — Progress Notes (Signed)
@Patient  ID: Deborah May, female    DOB: 04-Oct-1961, 62 y.o.   MRN: [id number]  Chief Complaint  Patient presents with   Acute Visit    No feeling well for a week and a half.  A week ago she coughed every time she talked, she coughed.   She was tight in her chest, nothing to come up.  Today, she feels a little better.  Noticed that she was sweating.  No chills or body aches.  Recently got a puppy, 10 lbs.  Just lifting the puppy, feels heavy.  At one time she was tight under her breast, today it is better.    Referring provider: No ref. provider found  HPI: 62 year old female, former smoker followed for COPD and lung nodule. She is a patient of Dr. Kavin Leech and last seen in office 01/20/2023. Past medical history significant for allergic rhinitis.   TEST/EVENTS:  05/30/2019 PFT: FVC 80, FEV1 64, ratio 64, TLC 108, DLCO 102. Moderate OSA without reversibility  01/18/2023 super D CT chest: Atherosclerosis.  Moderate centrilobular and paraseptal emphysema with mild diffuse bronchial wall thickening.  Symmetric mild biapical subpleural reticular nodular opacities, 4 mm RUL and 7 mm LUL.   01/20/2023: OV with Dr. Delton Coombes. Moderately severe obstruction on PFT. Good functional capacity, rarely needs albuterol. She is not on scheduled BD therapy. CT chest with emphysematous changes. Suspect that she will likely need scheduled BD at some point - base on clinical status. Recent exacerbation improving with prednisone and amox. CT chest with pulmonary nodule in question is likely linear scar in RLL. No discrete nodule. Referred to lung cancer screening program.  05/31/2023: Today -acute Patient presents today for acute visit with her husband. Early last week, she started having difficulties with a persistent cough.  She also had a lot of chest tightness but folic she can never produce any phlegm.  Her cough was so bad that every time she talked she would just start coughing again.  She was feeling more short  of breath as well and also noticing some more wheezing.  She does feel like she is finally feeling a little bit better over the last couple of days but still not back to her baseline.  She did start producing a small amount of phlegm.  Initially, it was yellow to green but it is been clear the past day or 2.  Still having an occasional wheeze.  Denies any fevers, chills, body aches, night sweats, hemoptysis.  No sinus symptoms. She does feel more weak than usual but this has also gotten better. She's using her rescue inhaler a few times a day, which helps. She did get a new puppy but tells me that her symptoms started prior to this. She thinks going into [place] and [employer] may have set it off.   Allergies  Allergen Reactions   Cephalosporins    Latex    Morphine And Codeine    Other     Mycins and cyclins per patient    Tetracyclines & Related     Immunization History  Administered Date(s) Administered   Pneumococcal Conjugate-13 05/20/2017   Pneumococcal Polysaccharide-23 10/25/2018   Pneumococcal-Unspecified 05/24/2018   Tdap 03/13/2014, 06/25/2015    Past Medical History:  Diagnosis Date   Allergy    COPD (chronic obstructive pulmonary disease) (HCC)    Diverticulosis    Vitamin B12 deficiency    Vitamin D deficiency     Tobacco History: Social History  Tobacco Use  Smoking Status Former   Current packs/day: 0.00   Average packs/day: 0.8 packs/day for 40.0 years (30.0 ttl pk-yrs)   Types: Cigarettes   Start date: 02/21/1979   Quit date: 02/21/2019   Years since quitting: 4.2  Smokeless Tobacco Never  Tobacco Comments   never quite 1ppd   Counseling given: Not Answered Tobacco comments: never quite 1ppd   Outpatient Medications Prior to Visit  Medication Sig Dispense Refill   amoxicillin (AMOXIL) 500 MG capsule Take 1 capsule (500 mg total) by mouth 2 (two) times daily. 14 capsule 0   APPLE CIDER VINEGAR PO Take by mouth.     aspirin EC 81 MG tablet Take 81 mg  by mouth daily.     Cholecalciferol (VITAMIN D3) 50 MCG (2000 UT) capsule Take 2,000 Units by mouth daily.     clobetasol ointment (TEMOVATE) 0.05 % daily as needed.     ELDERBERRY PO Take by mouth.     GARLIC PO Take by mouth.     Ginger, Zingiber officinalis, (GINGER PO) Take by mouth.     loratadine (CLARITIN) 10 MG tablet Take 10 mg by mouth daily. Pt takes every other day     NON FORMULARY Take 500 mg by mouth daily.     Turmeric (QC TUMERIC COMPLEX PO) Take by mouth.     albuterol (PROVENTIL HFA) 108 (90 Base) MCG/ACT inhaler Inhale 1 puff into the lungs every 6 (six) hours as needed for wheezing or shortness of breath. 18 g 3   amoxicillin-clavulanate (AUGMENTIN) 875-125 MG tablet Take 1 tablet by mouth every 12 (twelve) hours. 14 tablet 0   vitamin B-12 (CYANOCOBALAMIN) 1000 MCG tablet Take 1,000 mcg by mouth daily.     Zinc 22.5 MG TABS Take 1 tablet by mouth every other day. (Patient not taking: Reported on 05/31/2023)     No facility-administered medications prior to visit.     Review of Systems:   Constitutional: No weight loss or gain, night sweats, fevers, chills. +fatigue, lassitude. HEENT: No headaches, difficulty swallowing, tooth/dental problems, or sore throat. No sneezing, itching, ear ache, nasal congestion, or post nasal drip CV:  No chest pain, orthopnea, PND, swelling in lower extremities, anasarca, dizziness, palpitations, syncope Resp: +shortness of breath with exertion; chest tightness; cough; wheeze. No excess mucus or change in color of mucus. No hemoptysis. No chest wall deformity GI:  No heartburn, indigestion GU: No dysuria, change in color of urine, urgency or frequency.  No flank pain, no hematuria  Skin: No rash, lesions, ulcerations MSK:  No joint pain or swelling.   Neuro: No dizziness or lightheadedness.  Psych: No depression or anxiety. Mood stable.     Physical Exam:  BP 120/88 (BP Location: Right Arm, Patient Position: Sitting, Cuff Size:  Normal)   Pulse 81   Temp 97.8 F (36.6 C) (Oral)   Ht 5\' 3"  (1.6 m)   Wt 160 lb (72.6 kg)   SpO2 96%   BMI 28.34 kg/m   GEN: Pleasant, interactive, well-kempt; in no acute distress. HEENT:  Normocephalic and atraumatic. PERRLA. Sclera white. Nasal turbinates pink, moist and patent bilaterally. No rhinorrhea present. Oropharynx pink and moist, without exudate or edema. No lesions, ulcerations, or postnasal drip.  NECK:  Supple w/ fair ROM. No JVD present. Normal carotid impulses w/o bruits. Thyroid symmetrical with no goiter or nodules palpated. No lymphadenopathy.   CV: RRR, no m/r/g, no peripheral edema. Pulses intact, +2 bilaterally. No cyanosis, pallor or clubbing. PULMONARY:  Unlabored, regular breathing. Expiratory wheeze bilaterally A&P. No accessory muscle use.  GI: BS present and normoactive. Soft, non-tender to palpation. No organomegaly or masses detected.  MSK: No erythema, warmth or tenderness. Cap refil <2 sec all extrem. No deformities or joint swelling noted.  Neuro: A/Ox3. No focal deficits noted.   Skin: Warm, no lesions or rashe Psych: Normal affect and behavior. Judgement and thought content appropriate.     Lab Results:  CBC    Component Value Date/Time   WBC 3.8 (L) 10/20/2020 1337   RBC 4.38 10/20/2020 1337   HGB 13.5 10/20/2020 1337   HCT 40.1 10/20/2020 1337   PLT 151 10/20/2020 1337   MCV 91.6 10/20/2020 1337   MCH 30.8 10/20/2020 1337   MCHC 33.7 10/20/2020 1337   RDW 12.8 10/20/2020 1337   LYMPHSABS 0.7 10/20/2020 1337   MONOABS 0.4 10/20/2020 1337   EOSABS 0.0 10/20/2020 1337   BASOSABS 0.0 10/20/2020 1337    BMET    Component Value Date/Time   NA 138 10/20/2020 1337   K 3.2 (L) 10/20/2020 1337   CL 107 10/20/2020 1337   CO2 23 10/20/2020 1337   GLUCOSE 110 (H) 10/20/2020 1337   BUN 17 10/20/2020 1337   CREATININE 0.79 10/20/2020 1337   CALCIUM 8.7 (L) 10/20/2020 1337   GFRNONAA >60 10/20/2020 1337   GFRAA >60 06/26/2020 1243     BNP No results found for: "BNP"   Imaging:  No results found.  Administration History     None          Latest Ref Rng & Units 05/30/2019   10:50 AM  PFT Results  FVC-Pre L 2.58  P  FVC-Predicted Pre % 80  P  FVC-Post L 2.73  P  FVC-Predicted Post % 85  P  Pre FEV1/FVC % % 62  P  Post FEV1/FCV % % 64  P  FEV1-Pre L 1.59  P  FEV1-Predicted Pre % 64  P  FEV1-Post L 1.75  P  DLCO uncorrected ml/min/mmHg 20.02  P  DLCO UNC% % 102  P  DLVA Predicted % 105  P  TLC L 5.32  P  TLC % Predicted % 108  P  RV % Predicted % 138  P    P Preliminary result    No results found for: "NITRICOXIDE"      Assessment & Plan:   COPD (chronic obstructive pulmonary disease) (HCC) AECOPD. Some clinical improvement but still with increased dyspnea and cough from baseline. She has bronchospasm on exam today. We will treat her with prednisone taper. She is unable to tolerate higher doses so will start her at 20 mg and taper down. Hold off on abx given improvement and lack of infectious symptoms. Will obtain imaging and empiric abx if symptoms do not improve or worsen. Action plan in place. This is her second exacerbation within 6 months. Recommend she start maintenance therapy with LAMA/LABA. She was agreeable to this. Medication education provided and side effect profile reviewed. Teachback performed. Reviewed role of maintenance vs rescue.   Patient Instructions  Continue Albuterol inhaler 2 puffs or 3 mL neb every 6 hours as needed for shortness of breath or wheezing. Notify if symptoms persist despite rescue inhaler/neb use.  Start Stiolto 2 puffs daily. This is your new maintenance inhaler that you will use daily regardless of how your breathing is. It is not for emergencies/rescue purposes Prednisone 40 mg daily for 5 days. Take in AM with food Mucinex DM  Twice daily for cough/congestion Benzonatate 1 capsule Three times a day as needed for cough  Follow up in 4-5 weeks with  Dr. Delton Coombes or Katie Noor Witte,NP. If symptoms do not improve or worsen, please contact office for sooner follow up or seek emergency care.    Tobacco abuse, in remission Referred to lung cancer screening program at last OV.   I spent 35 minutes of dedicated to the care of this patient on the date of this encounter to include pre-visit review of records, face-to-face time with the patient discussing conditions above, post visit ordering of testing, clinical documentation with the electronic health record, making appropriate referrals as documented, and communicating necessary findings to members of the patients care team.  Noemi Chapel, NP 05/31/2023  Pt aware and understands NP's role.

## 2023-05-31 NOTE — Progress Notes (Signed)
Patient seen in the office today and instructed on use of Stiolto.  Patient expressed understanding and demonstrated technique. 

## 2023-05-31 NOTE — Patient Instructions (Addendum)
Continue Albuterol inhaler 2 puffs or 3 mL neb every 6 hours as needed for shortness of breath or wheezing. Notify if symptoms persist despite rescue inhaler/neb use.  Start Stiolto 2 puffs daily. This is your new maintenance inhaler that you will use daily regardless of how your breathing is. It is not for emergencies/rescue purposes Prednisone 40 mg daily for 5 days. Take in AM with food Mucinex DM Twice daily for cough/congestion Benzonatate 1 capsule Three times a day as needed for cough  Follow up in 4-5 weeks with Dr. Delton Coombes or Katie Alyssa Mancera,NP. If symptoms do not improve or worsen, please contact office for sooner follow up or seek emergency care.

## 2023-06-18 ENCOUNTER — Telehealth: Payer: Self-pay | Admitting: Emergency Medicine

## 2023-06-18 NOTE — Telephone Encounter (Signed)
Patient has a cough that causes a burning feeling in her chest. She has COPD and allergies. She was been taking her medications regular that were advised by her pharmacist. Please call and advise [phone] or [phone]

## 2023-06-21 ENCOUNTER — Encounter: Payer: Self-pay | Admitting: Pulmonary Disease

## 2023-06-21 ENCOUNTER — Ambulatory Visit (INDEPENDENT_AMBULATORY_CARE_PROVIDER_SITE_OTHER): Payer: BC Managed Care – PPO

## 2023-06-21 ENCOUNTER — Ambulatory Visit (INDEPENDENT_AMBULATORY_CARE_PROVIDER_SITE_OTHER): Payer: BC Managed Care – PPO | Admitting: Pulmonary Disease

## 2023-06-21 VITALS — BP 122/78 | HR 87 | Temp 97.4°F | Ht 63.0 in | Wt 160.0 lb

## 2023-06-21 DIAGNOSIS — J441 Chronic obstructive pulmonary disease with (acute) exacerbation: Secondary | ICD-10-CM

## 2023-06-21 DIAGNOSIS — R053 Chronic cough: Secondary | ICD-10-CM

## 2023-06-21 MED ORDER — AMOXICILLIN-POT CLAVULANATE 875-125 MG PO TABS: 1.0000 | ORAL_TABLET | Freq: Two times a day (BID) | ORAL | 0 refills | Status: DC

## 2023-06-21 MED ORDER — PREDNISONE 10 MG PO TABS
ORAL_TABLET | ORAL | 0 refills | Status: AC
Start: 2023-06-21 — End: 2023-07-05

## 2023-06-21 NOTE — Patient Instructions (Signed)
-  Order chest x-ray to rule out pneumonia. -Prescribe Augmentin for 7 days due to patient's allergy to macrolides. -Start a longer course of prednisone:20mg  daily for 1 week, then 10mg  daily for 1 week.

## 2023-06-21 NOTE — Progress Notes (Signed)
Deborah May    [id number]    10-29-1960  Primary Care Physician:Gates, Molly Maduro, MD  Referring Physician: Marden Noble, MD [address] [place],  [place] [address]  Chief complaint: Acute visit for COPD exacerbation  HPI: Discussed the use of AI scribe software for clinical note transcription with the patient, who gave verbal consent to proceed.  History of Present Illness   The patient, with a history of COPD, follows with Dr/ Delton Coombes, presents with a 'burning' sensation in their chest and a non-productive cough. They describe the sensation as similar to bronchitis. They have been taking Stiolto since an acute visit in August 28th, which has provided minimal relief. They also take Tussin DM, loratadine, and [place] ginseng, an herbal immune booster. They deny fever, but report feeling chilly intermittently. They have a history of pneumonia in the right lower lung lobe, during which they did not present with typical symptoms such as fever or clamminess.  In addition to the [facility], they were previously prescribed a short course of prednisone, which seemed to improve their symptoms temporarily. However, the cough returned after the course of prednisone was completed. They report that their cough worsens with talking and they struggle to hold in a deep breath. They also have a history of tachycardia with prednisone doses above 35mg .       Outpatient Encounter Medications as of 06/21/2023  Medication Sig   albuterol (PROVENTIL HFA) 108 (90 Base) MCG/ACT inhaler Inhale 1 puff into the lungs every 6 (six) hours as needed for wheezing or shortness of breath.   APPLE CIDER VINEGAR PO Take by mouth.   aspirin EC 81 MG tablet Take 81 mg by mouth daily.   benzonatate (TESSALON) 200 MG capsule Take 1 capsule (200 mg total) by mouth 3 (three) times daily as needed for cough.   Cholecalciferol (VITAMIN D3) 50 MCG (2000 UT) capsule Take 2,000 Units by mouth daily.   clobetasol  ointment (TEMOVATE) 0.05 % daily as needed.   ELDERBERRY PO Take by mouth.   GARLIC PO Take by mouth.   Ginger, Zingiber officinalis, (GINGER PO) Take by mouth.   loratadine (CLARITIN) 10 MG tablet Take 10 mg by mouth daily. Pt takes every other day   NON FORMULARY Take 500 mg by mouth daily.   Tiotropium Bromide-Olodaterol (STIOLTO RESPIMAT) 2.5-2.5 MCG/ACT AERS Inhale 2 puffs into the lungs daily.   Tiotropium Bromide-Olodaterol (STIOLTO RESPIMAT) 2.5-2.5 MCG/ACT AERS Inhale 2 puffs into the lungs daily.   Turmeric (QC TUMERIC COMPLEX PO) Take by mouth.   amoxicillin (AMOXIL) 500 MG capsule Take 1 capsule (500 mg total) by mouth 2 (two) times daily. (Patient not taking: Reported on 06/21/2023)   predniSONE (DELTASONE) 20 MG tablet Take 2 tablets for 5 days then 1 tablet for 5 days then stop (Patient not taking: Reported on 06/21/2023)   No facility-administered encounter medications on file as of 06/21/2023.    Physical Exam: Blood pressure 122/78, pulse 87, temperature (!) 97.4 F (36.3 C), temperature source Temporal, height 5\' 3"  (1.6 m), weight 160 lb (72.6 kg), SpO2 97%. Gen:      No acute distress HEENT:  EOMI, sclera anicteric Neck:     No masses; no thyromegaly Lungs:    Clear to auscultation bilaterally; normal respiratory effort CV:         Regular rate and rhythm; no murmurs Abd:      + bowel sounds; soft, non-tender; no palpable masses, no distension  Ext:    No edema; adequate peripheral perfusion Skin:      Warm and dry; no rash Neuro: alert and oriented x 3 Psych: normal mood and affect  Data Reviewed: Imaging:  PFTs:  Labs:  Assessment and Plan    COPD exacerbation Persistent cough and dyspnea despite recent initiation of Stiolto and a course of prednisone. No fever or productive cough. -Order chest x-ray to rule out pneumonia. -Prescribe Augmentin for 7 days due to patient's allergy to macrolides. -Start a longer course of prednisone:20mg  daily for 1 week,  then 10mg  daily for 1 week.  COPD maintenance Recently started on Stiolto [employer] as it is a good inhaler for COPD and patient was not previously on any inhaler.       Chilton Greathouse MD [facility] 06/21/2023, 10:29 AM  CC: Marden Noble, MD

## 2023-06-21 NOTE — Telephone Encounter (Signed)
Primary Pulmonologist: [facility] Last office visit and with whom: 05/31/2023 Rhunette Croft What do we see them for (pulmonary problems): COPD Last OV assessment/plan:   Continue Albuterol inhaler 2 puffs or 3 mL neb every 6 hours as needed for shortness of breath or wheezing. Notify if symptoms persist despite rescue inhaler/neb use.   Start Stiolto 2 puffs daily. This is your new maintenance inhaler that you will use daily regardless of how your breathing is. It is not for emergencies/rescue purposes Prednisone 40 mg daily for 5 days. Take in AM with food Mucinex DM Twice daily for cough/congestion Benzonatate 1 capsule Three times a day as needed for cough   Follow up in 4-5 weeks with Dr. Delton Coombes or Katie Cobb,NP. If symptoms do not improve or worsen, please contact office for sooner follow up or seek emergency care.   Was appointment offered to patient (explain)?  Seeing Dr. Isaiah Serge today.   Reason for call: Called and spoke with patient, she is seeing Dr. Isaiah Serge this morning.  Nothing further needed.    (examples of things to ask: : When did symptoms start? Fever? Cough? Productive? Color to sputum? More sputum than usual? Wheezing? Have you needed increased oxygen? Are you taking your respiratory medications? What over the counter measures have you tried?)  Allergies  Allergen Reactions   Cephalosporins    Latex    Morphine And Codeine    Other     Mycins and cyclins per patient    Tetracyclines & Related     Immunization History  Administered Date(s) Administered   Pneumococcal Conjugate-13 05/20/2017   Pneumococcal Polysaccharide-23 10/25/2018   Pneumococcal-Unspecified 05/24/2018   Tdap 03/13/2014, 06/25/2015

## 2023-06-29 ENCOUNTER — Ambulatory Visit: Payer: BC Managed Care – PPO | Admitting: Emergency Medicine

## 2023-07-25 ENCOUNTER — Encounter (HOSPITAL_BASED_OUTPATIENT_CLINIC_OR_DEPARTMENT_OTHER): Payer: Self-pay | Admitting: Emergency Medicine

## 2023-07-25 ENCOUNTER — Other Ambulatory Visit: Payer: Self-pay

## 2023-07-25 ENCOUNTER — Emergency Department (HOSPITAL_BASED_OUTPATIENT_CLINIC_OR_DEPARTMENT_OTHER)
Admission: EM | Admit: 2023-07-25 | Discharge: 2023-07-26 | Disposition: A | Discharge status: Home / Self Care | Payer: BC Managed Care – PPO | Attending: Emergency Medicine | Admitting: Emergency Medicine

## 2023-07-25 DIAGNOSIS — Z91040 Latex allergy status: Secondary | ICD-10-CM | POA: Diagnosis not present

## 2023-07-25 DIAGNOSIS — J449 Chronic obstructive pulmonary disease, unspecified: Secondary | ICD-10-CM | POA: Insufficient documentation

## 2023-07-25 DIAGNOSIS — M791 Myalgia, unspecified site: Secondary | ICD-10-CM | POA: Insufficient documentation

## 2023-07-25 DIAGNOSIS — N3001 Acute cystitis with hematuria: Secondary | ICD-10-CM | POA: Insufficient documentation

## 2023-07-25 DIAGNOSIS — R1011 Right upper quadrant pain: Secondary | ICD-10-CM | POA: Diagnosis present

## 2023-07-25 DIAGNOSIS — Z7982 Long term (current) use of aspirin: Secondary | ICD-10-CM | POA: Insufficient documentation

## 2023-07-25 DIAGNOSIS — M7918 Myalgia, other site: Secondary | ICD-10-CM

## 2023-07-25 LAB — CBC
HCT: 38.9 % (ref 36.0–46.0)
Hemoglobin: 13 g/dL (ref 12.0–15.0)
MCH: 30.8 pg (ref 26.0–34.0)
MCHC: 33.4 g/dL (ref 30.0–36.0)
MCV: 92.2 fL (ref 80.0–100.0)
Platelets: 234 10*3/uL (ref 150–400)
RBC: 4.22 MIL/uL (ref 3.87–5.11)
RDW: 13.2 % (ref 11.5–15.5)
WBC: 5.6 10*3/uL (ref 4.0–10.5)
nRBC: 0 % (ref 0.0–0.2)

## 2023-07-25 LAB — COMPREHENSIVE METABOLIC PANEL
ALT: 24 U/L (ref 0–44)
AST: 26 U/L (ref 15–41)
Albumin: 3.8 g/dL (ref 3.5–5.0)
Alkaline Phosphatase: 65 U/L (ref 38–126)
Anion gap: 11 (ref 5–15)
BUN: 26 mg/dL — ABNORMAL HIGH (ref 8–23)
CO2: 23 mmol/L (ref 22–32)
Calcium: 8.9 mg/dL (ref 8.9–10.3)
Chloride: 105 mmol/L (ref 98–111)
Creatinine, Ser: 0.68 mg/dL (ref 0.44–1.00)
GFR, Estimated: >60 mL/min (ref >60)
Glucose, Bld: 107 mg/dL — ABNORMAL HIGH (ref 70–99)
Potassium: 3.6 mmol/L (ref 3.5–5.1)
Sodium: 139 mmol/L (ref 135–145)
Total Bilirubin: 0.4 mg/dL (ref 0.3–1.2)
Total Protein: 7 g/dL (ref 6.5–8.1)

## 2023-07-25 LAB — LIPASE, BLOOD: Lipase: 33 U/L (ref 11–51)

## 2023-07-25 NOTE — ED Provider Notes (Signed)
[facility] Provider Note   CSN: [id number] Arrival date & time: 07/25/23  2122     History {Add pertinent medical, surgical, social history, OB history to HPI:1} Chief Complaint  Patient presents with   Abdominal Pain    Deborah May is a 62 y.o. female.   Abdominal Pain      Home Medications Prior to Admission medications   Medication Sig Start Date End Date Taking? Authorizing Provider  albuterol (PROVENTIL HFA) 108 (90 Base) MCG/ACT inhaler Inhale 1 puff into the lungs every 6 (six) hours as needed for wheezing or shortness of breath. 05/31/23   Cobb, Ruby Cola, NP  amoxicillin (AMOXIL) 500 MG capsule Take 1 capsule (500 mg total) by mouth 2 (two) times daily. Patient not taking: Reported on 06/21/2023 01/15/23   Leslye Peer, MD  amoxicillin-clavulanate (AUGMENTIN) 875-125 MG tablet Take 1 tablet by mouth 2 (two) times daily. 06/21/23   Mannam, Colbert Coyer, MD  APPLE CIDER VINEGAR PO Take by mouth.    [provider]  aspirin EC 81 MG tablet Take 81 mg by mouth daily.    [provider]  benzonatate (TESSALON) 200 MG capsule Take 1 capsule (200 mg total) by mouth 3 (three) times daily as needed for cough. 05/31/23   Cobb, Ruby Cola, NP  Cholecalciferol (VITAMIN D3) 50 MCG (2000 UT) capsule Take 2,000 Units by mouth daily.    [provider]  clobetasol ointment (TEMOVATE) 0.05 % daily as needed. 10/12/19   [provider]  ELDERBERRY PO Take by mouth.    [provider]  GARLIC PO Take by mouth.    [provider]  Ginger, Zingiber officinalis, (GINGER PO) Take by mouth.    [provider]  loratadine (CLARITIN) 10 MG tablet Take 10 mg by mouth daily. Pt takes every other day    [provider]  NON FORMULARY Take 500 mg by mouth daily.    [provider]  predniSONE (DELTASONE) 20 MG tablet Take 2 tablets for 5 days then 1 tablet for 5 days then  stop Patient not taking: Reported on 06/21/2023 05/31/23   Noemi Chapel, NP  Tiotropium Bromide-Olodaterol (STIOLTO RESPIMAT) 2.5-2.5 MCG/ACT AERS Inhale 2 puffs into the lungs daily. 05/31/23   Cobb, Ruby Cola, NP  Tiotropium Bromide-Olodaterol (STIOLTO RESPIMAT) 2.5-2.5 MCG/ACT AERS Inhale 2 puffs into the lungs daily. 05/31/23   Cobb, Ruby Cola, NP  Turmeric (QC TUMERIC COMPLEX PO) Take by mouth.    [provider]      Allergies    Cephalosporins, Latex, Morphine and codeine, Other, and Tetracyclines & related    Review of Systems   Review of Systems  Gastrointestinal:  Positive for abdominal pain.    Physical Exam Updated Vital Signs BP (!) 161/69 (BP Location: Right Arm)   Pulse 74   Temp (!) 97.5 F (36.4 C) (Oral)   Resp 20   Ht 5\' 3"  (1.6 m)   Wt 72.1 kg   SpO2 95%   BMI 28.17 kg/m  Physical Exam  ED Results / Procedures / Treatments   Labs (all labs ordered are listed, but only abnormal results are displayed) Labs Reviewed  COMPREHENSIVE METABOLIC PANEL - Abnormal; Notable for the following components:      Result Value   Glucose, Bld 107 (*)    BUN 26 (*)    All other components within normal limits  LIPASE, BLOOD  CBC  URINALYSIS, ROUTINE W  REFLEX MICROSCOPIC    EKG None  Radiology No results found.  Procedures Procedures  {Document cardiac monitor, telemetry assessment procedure when appropriate:1}  Medications Ordered in ED Medications - No data to display  ED Course/ Medical Decision Making/ A&P   {   Click here for ABCD2, HEART and other calculatorsREFRESH Note before signing :1}                              Medical Decision Making Amount and/or Complexity of Data Reviewed Labs: ordered.   ***  {Document critical care time when appropriate:1} {Document review of labs and clinical decision tools ie heart score, Chads2Vasc2 etc:1}  {Document your independent review of radiology images, and any outside  records:1} {Document your discussion with family members, caretakers, and with consultants:1} {Document social determinants of health affecting pt's care:1} {Document your decision making why or why not admission, treatments were needed:1} Final Clinical Impression(s) / ED Diagnoses Final diagnoses:  None    Rx / DC Orders ED Discharge Orders     None

## 2023-07-25 NOTE — ED Triage Notes (Signed)
Pt c/o RUQ pain since Fri; very tender to palpation, but also increases with movement; no injury; denies NVD

## 2023-07-26 ENCOUNTER — Emergency Department (HOSPITAL_BASED_OUTPATIENT_CLINIC_OR_DEPARTMENT_OTHER): Payer: BC Managed Care – PPO

## 2023-07-26 LAB — URINALYSIS, ROUTINE W REFLEX MICROSCOPIC
Bilirubin Urine: NEGATIVE
Glucose, UA: NEGATIVE mg/dL
Ketones, ur: NEGATIVE mg/dL
Nitrite: NEGATIVE
Protein, ur: NEGATIVE mg/dL
Specific Gravity, Urine: >=1.03 (ref 1.005–1.030)
pH: 6 (ref 5.0–8.0)

## 2023-07-26 LAB — URINALYSIS, MICROSCOPIC (REFLEX): WBC, UA: >50 WBC/hpf (ref 0–5)

## 2023-07-26 MED ORDER — NITROFURANTOIN MONOHYD MACRO 100 MG PO CAPS: 100.0000 mg | ORAL_CAPSULE | Freq: Two times a day (BID) | ORAL | 0 refills | Status: DC

## 2023-07-26 MED ORDER — KETOROLAC TROMETHAMINE 15 MG/ML IJ SOLN
15.0000 mg | Freq: Once | INTRAMUSCULAR | Status: AC
Start: 2023-07-26 — End: 2023-07-26
  Administered 2023-07-26: 15 mg via INTRAVENOUS
  Filled 2023-07-26: qty 1

## 2023-07-26 MED ORDER — ONDANSETRON HCL 4 MG/2ML IJ SOLN
4.0000 mg | Freq: Once | INTRAMUSCULAR | Status: DC
  Filled 2023-07-26: qty 2

## 2023-07-26 MED ORDER — LIDOCAINE 5 % EX PTCH
1.0000 | MEDICATED_PATCH | CUTANEOUS | Status: DC
  Administered 2023-07-26: 1 via TRANSDERMAL
  Filled 2023-07-26: qty 1

## 2023-07-26 MED ORDER — LACTATED RINGERS IV BOLUS
1000.0000 mL | Freq: Once | INTRAVENOUS | Status: AC
  Administered 2023-07-26: 1000 mL via INTRAVENOUS

## 2023-07-26 MED ORDER — NAPROXEN 500 MG PO TABS: 500.0000 mg | ORAL_TABLET | Freq: Two times a day (BID) | ORAL | 0 refills | Status: DC

## 2023-07-26 MED ORDER — LIDOCAINE 5 % EX PTCH: 1.0000 | MEDICATED_PATCH | CUTANEOUS | 0 refills | Status: DC

## 2023-07-26 MED ORDER — FENTANYL CITRATE PF 50 MCG/ML IJ SOSY
50.0000 ug | PREFILLED_SYRINGE | Freq: Once | INTRAMUSCULAR | Status: DC
  Filled 2023-07-26: qty 1

## 2023-07-26 MED ORDER — CYCLOBENZAPRINE HCL 10 MG PO TABS: 10.0000 mg | ORAL_TABLET | Freq: Two times a day (BID) | ORAL | 0 refills | Status: DC | PRN

## 2023-07-26 NOTE — Discharge Instructions (Addendum)
Your CT imaging and laboratory evaluation was overall reassuring.  Your symptoms point more towards a musculoskeletal component of pain.  Treat with NSAIDs, lidocaine patch, rest, intermittent ice and heating pad and Flexeril, do not operate heavy machinery after taking Flexeril.  Follow-up with your primary care provider.  Your urinalysis showed evidence of UTI and given your symptoms of frequency we will treat with antibiotics.

## 2023-07-28 LAB — URINE CULTURE

## 2023-08-03 ENCOUNTER — Telehealth: Payer: Self-pay | Admitting: Nurse Practitioner

## 2023-08-03 NOTE — Telephone Encounter (Signed)
Pt called was told [facility] was no longer in network. I called her and left message letting her know she was still in network nothing further needed

## 2023-08-04 ENCOUNTER — Ambulatory Visit (INDEPENDENT_AMBULATORY_CARE_PROVIDER_SITE_OTHER): Payer: BC Managed Care – PPO | Admitting: Nurse Practitioner

## 2023-08-04 ENCOUNTER — Encounter: Payer: Self-pay | Admitting: Nurse Practitioner

## 2023-08-04 VITALS — BP 128/80 | HR 86 | Ht 63.0 in | Wt 160.2 lb

## 2023-08-04 DIAGNOSIS — R911 Solitary pulmonary nodule: Secondary | ICD-10-CM | POA: Diagnosis not present

## 2023-08-04 DIAGNOSIS — J449 Chronic obstructive pulmonary disease, unspecified: Secondary | ICD-10-CM | POA: Diagnosis not present

## 2023-08-04 MED ORDER — STIOLTO RESPIMAT 2.5-2.5 MCG/ACT IN AERS: 2.0000 | INHALATION_SPRAY | Freq: Every day | RESPIRATORY_TRACT | 5 refills | Status: DC

## 2023-08-04 NOTE — Patient Instructions (Addendum)
Continue Albuterol inhaler 2 puffs or 3 mL neb every 6 hours as needed for shortness of breath or wheezing. Notify if symptoms persist despite rescue inhaler/neb use. Continue Stiolto 2 puffs daily.  Activity as tolerated    Follow up in 4 months with Dr. Delton Coombes or Philis Nettle. If symptoms do not improve or worsen, please contact office for sooner follow up or seek emergency care.

## 2023-08-04 NOTE — Assessment & Plan Note (Signed)
Likely scarring in RLL. Referred to lung cancer screeing program at previous OV. Due 01/2024

## 2023-08-04 NOTE — Assessment & Plan Note (Addendum)
Resolved exacerbation. Clinically improved. Receives clinical benefit from [employer]. Understands role of maintenance therapy. Action plan in place. Encouraged to remain active. Declined flu vaccine.   Patient Instructions  Continue Albuterol inhaler 2 puffs or 3 mL neb every 6 hours as needed for shortness of breath or wheezing. Notify if symptoms persist despite rescue inhaler/neb use. Continue Stiolto 2 puffs daily.  Activity as tolerated    Follow up in 4 months with Dr. Delton Coombes or Philis Nettle. If symptoms do not improve or worsen, please contact office for sooner follow up or seek emergency care.

## 2023-08-04 NOTE — Progress Notes (Signed)
@Patient  ID: Deborah May, female    DOB: 11-17-1960, 62 y.o.   MRN: [id number]  Chief Complaint  Patient presents with   Follow-up    Pt is here for COPD F/U visit.     Referring provider: Marden Noble, MD  HPI: 62 year old female, former smoker followed for COPD and lung nodule. She is a patient of Dr. Kavin Leech and last seen in office 06/21/2023 by Dr Isaiah Serge for acute visit. Past medical history significant for allergic rhinitis.   TEST/EVENTS:  05/30/2019 PFT: FVC 80, FEV1 64, ratio 64, TLC 108, DLCO 102. Moderate OSA without reversibility  01/18/2023 super D CT chest: Atherosclerosis.  Moderate centrilobular and paraseptal emphysema with mild diffuse bronchial wall thickening.  Symmetric mild biapical subpleural reticular nodular opacities, 4 mm RUL and 7 mm LUL.   01/20/2023: OV with Dr. Delton Coombes. Moderately severe obstruction on PFT. Good functional capacity, rarely needs albuterol. She is not on scheduled BD therapy. CT chest with emphysematous changes. Suspect that she will likely need scheduled BD at some point - base on clinical status. Recent exacerbation improving with prednisone and amox. CT chest with pulmonary nodule in question is likely linear scar in RLL. No discrete nodule. Referred to lung cancer screening program.  05/31/2023: Ov with Cortina Vultaggio NP for acute visit with her husband. Early last week, she started having difficulties with a persistent cough.  She also had a lot of chest tightness but folic she can never produce any phlegm.  Her cough was so bad that every time she talked she would just start coughing again.  She was feeling more short of breath as well and also noticing some more wheezing.  She does feel like she is finally feeling a little bit better over the last couple of days but still not back to her baseline.  She did start producing a small amount of phlegm.  Initially, it was yellow to green but it is been clear the past day or 2.  Still having an occasional  wheeze.  Denies any fevers, chills, body aches, night sweats, hemoptysis.  No sinus symptoms. She does feel more weak than usual but this has also gotten better. She's using her rescue inhaler a few times a day, which helps. She did get a new puppy but tells me that her symptoms started prior to this. She thinks going into [employer] and [employer] may have set it off.   06/21/2023: OV with Dr. Isaiah Serge. Burning sensation in chest and non-productive cough. Started Stiolto end of August; minimal relief so far. Prednisone did help but cough returned after competing. Prescribed augmentin and longer course of prednisone. CXR without superimposed infection  08/04/2023: Today - follow up Patient presents today for follow up. She's feeling much better. Cough and chest congestion cleared up. She does feel like the Stiolto is helping her now. Chest feels more open and she doesn't feel like she's producing as much phlegm. On antibiotics for a UTI. Symptoms are improving. No concerns or complaints today.   Allergies  Allergen Reactions   Cephalosporins    Latex    Morphine And Codeine    Other     Mycins and cyclins per patient    Tetracyclines & Related     Immunization History  Administered Date(s) Administered   Pneumococcal Conjugate-13 05/20/2017   Pneumococcal Polysaccharide-23 10/25/2018   Pneumococcal-Unspecified 05/24/2018   Tdap 03/13/2014, 06/25/2015    Past Medical History:  Diagnosis Date   Allergy  COPD (chronic obstructive pulmonary disease) (HCC)    Diverticulosis    Vitamin B12 deficiency    Vitamin D deficiency     Tobacco History: Social History   Tobacco Use  Smoking Status Former   Current packs/day: 0.00   Average packs/day: 0.8 packs/day for 40.0 years (30.0 ttl pk-yrs)   Types: Cigarettes   Start date: 02/21/1979   Quit date: 02/21/2019   Years since quitting: 4.4  Smokeless Tobacco Never  Tobacco Comments   never quite 1ppd   Counseling given: Not  Answered Tobacco comments: never quite 1ppd   Outpatient Medications Prior to Visit  Medication Sig Dispense Refill   albuterol (PROVENTIL HFA) 108 (90 Base) MCG/ACT inhaler Inhale 1 puff into the lungs every 6 (six) hours as needed for wheezing or shortness of breath. 18 g 3   APPLE CIDER VINEGAR PO Take by mouth.     Cholecalciferol (VITAMIN D3) 50 MCG (2000 UT) capsule Take 2,000 Units by mouth daily.     clobetasol ointment (TEMOVATE) 0.05 % daily as needed.     ELDERBERRY PO Take by mouth.     GARLIC PO Take by mouth.     Ginger, Zingiber officinalis, (GINGER PO) Take by mouth.     loratadine (CLARITIN) 10 MG tablet Take 10 mg by mouth daily. Pt takes every other day     NON FORMULARY Take 500 mg by mouth daily.     Turmeric (QC TUMERIC COMPLEX PO) Take by mouth.     aspirin EC 81 MG tablet Take 81 mg by mouth daily.     benzonatate (TESSALON) 200 MG capsule Take 1 capsule (200 mg total) by mouth 3 (three) times daily as needed for cough. 30 capsule 1   cyclobenzaprine (FLEXERIL) 10 MG tablet Take 1 tablet (10 mg total) by mouth 2 (two) times daily as needed for muscle spasms. 20 tablet 0   lidocaine (LIDODERM) 5 % Place 1 patch onto the skin daily. Remove & Discard patch within 12 hours or as directed by MD 30 patch 0   naproxen (NAPROSYN) 500 MG tablet Take 1 tablet (500 mg total) by mouth 2 (two) times daily. 30 tablet 0   nitrofurantoin, macrocrystal-monohydrate, (MACROBID) 100 MG capsule Take 1 capsule (100 mg total) by mouth 2 (two) times daily. 10 capsule 0   predniSONE (DELTASONE) 20 MG tablet Take 2 tablets for 5 days then 1 tablet for 5 days then stop (Patient not taking: Reported on 06/21/2023) 15 tablet 0   Tiotropium Bromide-Olodaterol (STIOLTO RESPIMAT) 2.5-2.5 MCG/ACT AERS Inhale 2 puffs into the lungs daily. 4 g 5   Tiotropium Bromide-Olodaterol (STIOLTO RESPIMAT) 2.5-2.5 MCG/ACT AERS Inhale 2 puffs into the lungs daily.     No facility-administered medications prior to  visit.     Review of Systems:   Constitutional: No weight loss or gain, night sweats, fevers, chills. +fatigue, lassitude. HEENT: No headaches, difficulty swallowing, tooth/dental problems, or sore throat. No sneezing, itching, ear ache, nasal congestion, or post nasal drip CV:  No chest pain, orthopnea, PND, swelling in lower extremities, anasarca, dizziness, palpitations, syncope Resp: +shortness of breath with exertion (improved; minimal). No cough. No excess mucus or change in color of mucus. No wheeze. No hemoptysis. No chest wall deformity GI:  No heartburn, indigestion GU: +improving urinary symptoms Skin: No rash, lesions, ulcerations MSK:  No joint pain or swelling.   Neuro: No dizziness or lightheadedness.  Psych: No depression or anxiety. Mood stable.     Physical Exam:  BP 128/80 (BP Location: Right Arm, Cuff Size: Normal)   Pulse 86   Ht 5\' 3"  (1.6 m)   Wt 160 lb 3.2 oz (72.7 kg)   SpO2 96%   BMI 28.38 kg/m   GEN: Pleasant, interactive, well-kempt; in no acute distress. HEENT:  Normocephalic and atraumatic. PERRLA. Sclera white. Nasal turbinates pink, moist and patent bilaterally. No rhinorrhea present. Oropharynx pink and moist, without exudate or edema. No lesions, ulcerations, or postnasal drip.  NECK:  Supple w/ fair ROM. No JVD present. Normal carotid impulses w/o bruits. Thyroid symmetrical with no goiter or nodules palpated. No lymphadenopathy.   CV: RRR, no m/r/g, no peripheral edema. Pulses intact, +2 bilaterally. No cyanosis, pallor or clubbing. PULMONARY:  Unlabored, regular breathing. Clear bilaterally A&P w/o wheezes/rales/rhonchi. No accessory muscle use.  GI: BS present and normoactive. Soft, non-tender to palpation. No organomegaly or masses detected.  MSK: No erythema, warmth or tenderness. Cap refil <2 sec all extrem. No deformities or joint swelling noted.  Neuro: A/Ox3. No focal deficits noted.   Skin: Warm, no lesions or rashe Psych: Normal  affect and behavior. Judgement and thought content appropriate.     Lab Results:  CBC    Component Value Date/Time   WBC 5.6 07/25/2023 2144   RBC 4.22 07/25/2023 2144   HGB 13.0 07/25/2023 2144   HCT 38.9 07/25/2023 2144   PLT 234 07/25/2023 2144   MCV 92.2 07/25/2023 2144   MCH 30.8 07/25/2023 2144   MCHC 33.4 07/25/2023 2144   RDW 13.2 07/25/2023 2144   LYMPHSABS 0.7 10/20/2020 1337   MONOABS 0.4 10/20/2020 1337   EOSABS 0.0 10/20/2020 1337   BASOSABS 0.0 10/20/2020 1337    BMET    Component Value Date/Time   NA 139 07/25/2023 2144   K 3.6 07/25/2023 2144   CL 105 07/25/2023 2144   CO2 23 07/25/2023 2144   GLUCOSE 107 (H) 07/25/2023 2144   BUN 26 (H) 07/25/2023 2144   CREATININE 0.68 07/25/2023 2144   CALCIUM 8.9 07/25/2023 2144   GFRNONAA >60 07/25/2023 2144   GFRAA >60 06/26/2020 1243    BNP No results found for: "BNP"   Imaging:  CT ABDOMEN PELVIS WO CONTRAST  Result Date: 07/26/2023 CLINICAL DATA:  Acute nonlocalized abdominal pain. Right upper quadrant pain. Right upper quadrant pain since Friday with tenderness to palpation. No injury. EXAM: CT ABDOMEN AND PELVIS WITHOUT CONTRAST TECHNIQUE: Multidetector CT imaging of the abdomen and pelvis was performed following the standard protocol without IV contrast. RADIATION DOSE REDUCTION: This exam was performed according to the departmental dose-optimization program which includes automated exposure control, adjustment of the mA and/or kV according to patient size and/or use of iterative reconstruction technique. COMPARISON:  06/26/2020 FINDINGS: Lower chest: Linear atelectasis or fibrosis in the lung bases. Hepatobiliary: No focal liver abnormality is seen. No gallstones, gallbladder wall thickening, or biliary dilatation. Pancreas: Unremarkable. No pancreatic ductal dilatation or surrounding inflammatory changes. Spleen: Normal in size without focal abnormality. Adrenals/Urinary Tract: No adrenal gland nodules.  Kidneys are symmetrical. No hydronephrosis or hydroureter. No renal, ureteral, or bladder stones. No bladder wall thickening. Stomach/Bowel: Stomach, small bowel, and colon are not abnormally distended. No wall thickening or inflammatory changes are seen. Diverticulosis of the sigmoid colon without evidence of acute diverticulitis. Appendix is normal. Vascular/Lymphatic: Aortic atherosclerosis. No enlarged abdominal or pelvic lymph nodes. Reproductive: Status post hysterectomy. No adnexal masses. Other: No abdominal wall hernia or abnormality. No abdominopelvic ascites. Musculoskeletal: No acute or significant osseous findings.  IMPRESSION: No acute process demonstrated in the abdomen or pelvis. No evidence of bowel obstruction or inflammation. Diverticulosis of the colon without evidence of acute diverticulitis. Aortic atherosclerosis. Electronically Signed   By: Burman Nieves M.D.   On: 07/26/2023 02:56    Administration History     None          Latest Ref Rng & Units 05/30/2019   10:50 AM  PFT Results  FVC-Pre L 2.58  P  FVC-Predicted Pre % 80  P  FVC-Post L 2.73  P  FVC-Predicted Post % 85  P  Pre FEV1/FVC % % 62  P  Post FEV1/FCV % % 64  P  FEV1-Pre L 1.59  P  FEV1-Predicted Pre % 64  P  FEV1-Post L 1.75  P  DLCO uncorrected ml/min/mmHg 20.02  P  DLCO UNC% % 102  P  DLVA Predicted % 105  P  TLC L 5.32  P  TLC % Predicted % 108  P  RV % Predicted % 138  P    P Preliminary result    No results found for: "NITRICOXIDE"      Assessment & Plan:   COPD (chronic obstructive pulmonary disease) (HCC) Resolved exacerbation. Clinically improved. Receives clinical benefit from [employer]. Understands role of maintenance therapy. Action plan in place. Encouraged to remain active. Declined flu vaccine.   Patient Instructions  Continue Albuterol inhaler 2 puffs or 3 mL neb every 6 hours as needed for shortness of breath or wheezing. Notify if symptoms persist despite rescue  inhaler/neb use. Continue Stiolto 2 puffs daily.  Activity as tolerated    Follow up in 4 months with Dr. Delton Coombes or Philis Nettle. If symptoms do not improve or worsen, please contact office for sooner follow up or seek emergency care.    Pulmonary nodule Likely scarring in RLL. Referred to lung cancer screeing program at previous OV. Due 01/2024   I spent 25 minutes of dedicated to the care of this patient on the date of this encounter to include pre-visit review of records, face-to-face time with the patient discussing conditions above, post visit ordering of testing, clinical documentation with the electronic health record, making appropriate referrals as documented, and communicating necessary findings to members of the patients care team.  Noemi Chapel, NP 08/04/2023  Pt aware and understands NP's role.

## 2023-09-27 ENCOUNTER — Encounter: Payer: Self-pay | Admitting: Nurse Practitioner

## 2023-11-11 ENCOUNTER — Ambulatory Visit: Payer: BC Managed Care – PPO | Admitting: Emergency Medicine

## 2023-11-11 ENCOUNTER — Encounter: Payer: Self-pay | Admitting: Emergency Medicine

## 2023-11-11 VITALS — BP 118/78 | HR 81 | Ht 63.0 in | Wt 160.0 lb

## 2023-11-11 DIAGNOSIS — J449 Chronic obstructive pulmonary disease, unspecified: Secondary | ICD-10-CM

## 2023-11-11 DIAGNOSIS — R21 Rash and other nonspecific skin eruption: Secondary | ICD-10-CM | POA: Diagnosis not present

## 2023-11-11 NOTE — Patient Instructions (Addendum)
 We will stop your Stiolto now. Okay to keep your albuterol  available to use 2 puffs when needed for shortness of breath, chest tightness, wheezing. Keep track of how your rash does off the Stiolto.  If there is a relationship hopefully the rash will resolve. Also keep track of how your breathing does off the Stiolto.  This will help us  decide whether to try recheck continue with this medication or finding an alternative. Follow-up with either Dr. Shanyce Daris or K. Cobb in about 1 month to discuss next steps

## 2023-11-11 NOTE — Progress Notes (Signed)
 Subjective:    Patient ID: Deborah May, female    DOB: 1961-02-26, 63 y.o.   MRN: [id number]  HPI  ROV 01/20/23 --63 year old woman, former smoker with mild COPD who has not been on maintenance medications.  She had a chest x-ray in December 2023 that showed a questionable right lower lobe nodular density.  We performed a CT chest to further evaluate as below.  Since I last saw her she had flaring bronchitic symptoms, wheezing.  I treated her with prednisone  and amoxicillin , finishing now.  Today she reports that she is better - still has some hoarse voice and cough. Not on loratadine right now. Used albuterol  while she was ill, not since.   We are obstruction on pulmonary function testing from 2020  CT chest 01/18/2023 reviewed by me, shows emphysematous change bilaterally, mostly superiorly.  There is some subtle linear scar with more nodular focus at the right base.  Some bronchiectasis and airway thickening in the proximal anterior middle lobe.  No discrete nodules  ROV 11/11/2023  -- 63 year old woman with COPD, some mild bronchiectasis and airway thickening in the anterior middle lobe.  Since I last saw her she was seen for an acute visit and then again in October 2024.  She was started first on Stiolto. For the last month she has had rash, itching especially on her back. She wondered whether the stiolto was causing the problem, so she started decreasing the stiolto, now taking every other day. Never needs albuterol . She is unsure whether the stiolto helped her breathing much. No cough. Good functional capacity. Not on loratadine right now.    Review of Systems  Constitutional:  Negative for fever and unexpected weight change.  HENT:  Negative for congestion, dental problem, ear pain, nosebleeds, postnasal drip, rhinorrhea, sinus pressure, sneezing, sore throat and trouble swallowing.   Eyes:  Negative for redness and itching.  Respiratory:  Positive for cough and shortness of breath.  Negative for chest tightness and wheezing.   Cardiovascular:  Negative for palpitations and leg swelling.  Gastrointestinal:  Negative for nausea and vomiting.  Genitourinary:  Negative for dysuria.  Musculoskeletal:  Negative for joint swelling.  Skin:  Negative for rash.  Neurological:  Negative for headaches.  Hematological:  Does not bruise/bleed easily.  Psychiatric/Behavioral:  Negative for dysphoric mood. The patient is not nervous/anxious.      Allergies  Allergen Reactions   Cephalosporins    Latex    Morphine And Codeine     Other     Mycins and cyclins per patient    Stiolto Respimat  [Tiotropium Bromide-Olodaterol] Itching   Tetracyclines & Related      Outpatient Medications Prior to Visit  Medication Sig Dispense Refill   albuterol  (PROVENTIL  HFA) 108 (90 Base) MCG/ACT inhaler Inhale 1 puff into the lungs every 6 (six) hours as needed for wheezing or shortness of breath. 18 g 3   APPLE CIDER VINEGAR PO Take by mouth.     Cholecalciferol (VITAMIN D3) 50 MCG (2000 UT) capsule Take 2,000 Units by mouth daily.     clobetasol ointment (TEMOVATE) 0.05 % daily as needed.     diphenhydrAMINE (BENADRYL) 25 MG tablet Take 25 mg by mouth every 6 (six) hours as needed.     ELDERBERRY PO Take by mouth.     GARLIC PO Take by mouth.     Ginger, Zingiber officinalis, (GINGER PO) Take by mouth.     NON FORMULARY Take 500 mg by mouth daily.  Tiotropium Bromide-Olodaterol (STIOLTO RESPIMAT ) 2.5-2.5 MCG/ACT AERS Inhale 2 puffs into the lungs daily. 4 g 5   Turmeric (QC TUMERIC COMPLEX PO) Take by mouth.     loratadine (CLARITIN) 10 MG tablet Take 10 mg by mouth daily. Pt takes every other day     No facility-administered medications prior to visit.        Objective:    Vitals:   11/11/23 1403 11/11/23 1404  BP:  118/78  Pulse: 81   SpO2: 98%   Weight:  160 lb (72.6 kg)  Height:  5' 3 (1.6 m)    Gen: Pleasant, well-nourished, in no distress,  normal affect  ENT: No  lesions,  mouth clear,  oropharynx clear, no postnasal drip  Neck: No JVD, no stridor  Lungs: No use of accessory muscles, no crackles or wheezing on normal respiration, no wheeze on forced expiration  Cardiovascular: RRR, heart sounds normal, no murmur or gallops, no peripheral edema  Musculoskeletal: No deformities, no cyanosis or clubbing  Neuro: alert, awake, non focal  Skin: Warm, several unroofed papules scattered around on her back without any obvious hives or macular change.  She has old scars as well probably from previous lesions     Assessment & Plan:  Rash She has had a papular rash for about a month.  Question whether this is acute on chronic as she does have scarring as well.  She wonders whether the Stiolto might be the cause based on its side effect profile.  I think it is unlikely cause a rash like this.  We are going to hold it, see if the rash, itching improved.  If so then will be reasonable to rechallenge her on the Stiolto to see if the rash returns.  We can find an alternative to Stiolto if we think it is the culprit  COPD (chronic obstructive pulmonary disease) (HCC) Plan to hold the Stiolto now, keep albuterol  available.  She will keep track of how her breathing does.  If we cannot get back on Stiolto due to side effects then we can try to find an alternative    Lamar Chris, MD, PhD 11/11/2023, 2:30 PM [facility] [phone] or if no answer [phone]

## 2023-11-11 NOTE — Assessment & Plan Note (Signed)
 Plan to hold the Stiolto now, keep albuterol  available.  She will keep track of how her breathing does.  If we cannot get back on Stiolto due to side effects then we can try to find an alternative

## 2023-11-11 NOTE — Assessment & Plan Note (Signed)
 She has had a papular rash for about a month.  Question whether this is acute on chronic as she does have scarring as well.  She wonders whether the Stiolto might be the cause based on its side effect profile.  I think it is unlikely cause a rash like this.  We are going to hold it, see if the rash, itching improved.  If so then will be reasonable to rechallenge her on the Stiolto to see if the rash returns.  We can find an alternative to Stiolto if we think it is the culprit

## 2023-12-01 ENCOUNTER — Telehealth: Payer: Self-pay | Admitting: Emergency Medicine

## 2023-12-01 NOTE — Telephone Encounter (Signed)
 Patient went to the dermatologist and she has dermatitis and not an allergic reaction to the stiolto. She is currently on Dupixent for her dermatitis and was wondering if it would be safe for her to start taking the stiolto again. Please call and advise [phone]

## 2023-12-02 NOTE — Telephone Encounter (Signed)
 I called and spoke with pt. Pt states she would like to go back on Stiolto if it is safe. Pt states what she thought was an allergic reaction, was actually Dermatitis. Pt is currently on Dupixent, and pt wants to know if she can continue Dupixent and start Stiolto again as well, and if taking both is safe to do. Pt states she has not been out of breath and no wheezing since she started Dupixent and this has helped. Please advise.

## 2023-12-02 NOTE — Telephone Encounter (Signed)
 It would be safe.  I'd be fine with her getting back on Stiolto to see how it helps her breathing

## 2023-12-03 ENCOUNTER — Ambulatory Visit: Payer: BC Managed Care – PPO | Admitting: Nurse Practitioner

## 2023-12-06 NOTE — Telephone Encounter (Signed)
 Pt is aware of below message and voiced her understanding. Nothing further needed.

## 2023-12-06 NOTE — Telephone Encounter (Signed)
 Called and spoke to patient.  She is questioning if she could use Stiolto every other day. She stated that her pharmacist mentioned that being on both Stioto and dupixent should increase the risk for infection.  Dr. Delton Coombes, please advise.

## 2023-12-06 NOTE — Telephone Encounter (Signed)
 Stiolto should not change her risk for infection. It is fine for her to take it every day. The only reason to cut it down would be if she didn't think she needed any more frequently that every other day to maintain good breathing.

## 2023-12-06 NOTE — Telephone Encounter (Signed)
 Lm for patient.

## 2023-12-06 NOTE — Telephone Encounter (Signed)
 PT ret Margie's call. I let her know what Dr. said. She wonders if every other day using Stiolto would be more beneficial. Would the risk be worth the benefit due to she is on Dupixant and that may cause an infection, her pharmacists says.   Her CB # is [phone]  She currently has plenty of Stiolto.

## 2024-01-05 ENCOUNTER — Telehealth: Payer: Self-pay | Admitting: Acute Care

## 2024-01-05 NOTE — Telephone Encounter (Signed)
 Returned call from VM.  Patient requesting CPT code for LDCT.  Called but no answer.  Left VM with CPT code information. CPT [id number] for LDCT.  Also left our callback number if she needed further information

## 2024-01-06 ENCOUNTER — Encounter: Payer: Self-pay | Admitting: Emergency Medicine

## 2024-01-06 ENCOUNTER — Ambulatory Visit: Payer: BC Managed Care – PPO | Admitting: Emergency Medicine

## 2024-01-06 VITALS — BP 123/69 | HR 73 | Ht 63.0 in | Wt 163.0 lb

## 2024-01-06 DIAGNOSIS — J449 Chronic obstructive pulmonary disease, unspecified: Secondary | ICD-10-CM | POA: Diagnosis not present

## 2024-01-06 DIAGNOSIS — R911 Solitary pulmonary nodule: Secondary | ICD-10-CM | POA: Diagnosis not present

## 2024-01-06 NOTE — Patient Instructions (Addendum)
 We will continue Stiolto 2 puffs once daily.  If you have increasing side effects we can discuss possibly changing to an alternative. Keep albuterol available to use 2 puffs up to every 4 hours if needed for shortness of breath, chest tightness, wheezing.  Agree with continuing Dupixent and follow-up with dermatology. We will work on getting you into the lung cancer screening program so you can have your CT chest surveillance.  (The CPT code that you called about a 16109.) Follow with Dr Delton Coombes in 6 months or sooner if you have any problems

## 2024-01-06 NOTE — Assessment & Plan Note (Signed)
 Plan to get her into the lung cancer screening program to continue surveillance and screening

## 2024-01-06 NOTE — Assessment & Plan Note (Signed)
 She feels better on the Stiolto, less chest tightness, better breathing.  She does have left antecubital/arm pain that she describes at least in time to being on the Stiolto.  This medication can rarely cause arthralgias.  We discussed possibly making a change but for now she is okay staying on the Stiolto.  If it becomes more problematic then we will reconsider.

## 2024-01-06 NOTE — Telephone Encounter (Signed)
 Hi Deborah May would you mind calling her back to help get her LDCT arranged.  I discussed it with her today and she is willing to do it.  I will tell her the CPT code 40981 today.  Thank you

## 2024-01-06 NOTE — Progress Notes (Signed)
 Subjective:    Patient ID: Deborah May, female    DOB: [birth year], 63 y.o.   MRN: [id number]  HPI  ROV 11/11/2023  -- 63 year old woman with COPD, some mild bronchiectasis and airway thickening in the anterior middle lobe.  Since I last saw her she was seen for an acute visit and then again in October 2024.  She was started first on Stiolto. For the last month she has had rash, itching especially on her back. She wondered whether the stiolto was causing the problem, so she started decreasing the stiolto, now taking every other day. Never needs albuterol. She is unsure whether the stiolto helped her breathing much. No cough. Good functional capacity. Not on loratadine right now.   ROV 01/06/2024 --Deborah May is 63 with a history of COPD, mild bronchiectasis and airway thickening in the anterior middle lobe.  She has a pruritic rash on her back.  We discussed in February.  She had decreased her Stiolto given some question that the medication may have been the cause.  She has seen dermatology, is now on Dupixent for atopic dermatitis. She believes that it is helping the rash.  She reports today that she is back on the stiolto, has noticed L arm pain since she started back on the stiolto. She thinks it has helped her chest tightness. Her future chest imaging is planned to be lung CA screening.    Review of Systems  Constitutional:  Negative for fever and unexpected weight change.  HENT:  Negative for congestion, dental problem, ear pain, nosebleeds, postnasal drip, rhinorrhea, sinus pressure, sneezing, sore throat and trouble swallowing.   Eyes:  Negative for redness and itching.  Respiratory:  Positive for cough and shortness of breath. Negative for chest tightness and wheezing.   Cardiovascular:  Negative for palpitations and leg swelling.  Gastrointestinal:  Negative for nausea and vomiting.  Genitourinary:  Negative for dysuria.  Musculoskeletal:  Negative for joint swelling.  Skin:  Negative for  rash.  Neurological:  Negative for headaches.  Hematological:  Does not bruise/bleed easily.  Psychiatric/Behavioral:  Negative for dysphoric mood. The patient is not nervous/anxious.      Allergies  Allergen Reactions   Cephalosporins    Latex    Morphine And Codeine    Other     Mycins and cyclins per patient    Stiolto Respimat [Tiotropium Bromide-Olodaterol] Itching   Tetracyclines & Related      Outpatient Medications Prior to Visit  Medication Sig Dispense Refill   albuterol (PROVENTIL HFA) 108 (90 Base) MCG/ACT inhaler Inhale 1 puff into the lungs every 6 (six) hours as needed for wheezing or shortness of breath. 18 g 3   APPLE CIDER VINEGAR PO Take by mouth.     Cholecalciferol (VITAMIN D3) 50 MCG (2000 UT) capsule Take 2,000 Units by mouth daily.     clobetasol ointment (TEMOVATE) 0.05 % daily as needed.     diphenhydrAMINE (BENADRYL) 25 MG tablet Take 25 mg by mouth every 6 (six) hours as needed.     Dupilumab (DUPIXENT) 300 MG/2ML SOAJ Inject 300 mg into the skin every 14 (fourteen) days.     ELDERBERRY PO Take by mouth.     GARLIC PO Take by mouth.     Ginger, Zingiber officinalis, (GINGER PO) Take by mouth.     NON FORMULARY Take 500 mg by mouth daily.     Tiotropium Bromide-Olodaterol (STIOLTO RESPIMAT) 2.5-2.5 MCG/ACT AERS Inhale 2 puffs into the lungs daily. 4  g 5   Turmeric (QC TUMERIC COMPLEX PO) Take by mouth.     No facility-administered medications prior to visit.        Objective:    Vitals:   01/06/24 1105  BP: 123/69  Pulse: 73  SpO2: 95%  Weight: 163 lb (73.9 kg)  Height: 5\' 3"  (1.6 m)    Gen: Pleasant, well-nourished, in no distress,  normal affect  ENT: No lesions,  mouth clear,  oropharynx clear, no postnasal drip  Neck: No JVD, no stridor  Lungs: No use of accessory muscles, no crackles or wheezing on normal respiration, no wheeze on forced expiration  Cardiovascular: RRR, heart sounds normal, no murmur or gallops, no peripheral  edema  Musculoskeletal: No deformities, no cyanosis or clubbing  Neuro: alert, awake, non focal     Assessment & Plan:  COPD (chronic obstructive pulmonary disease) (HCC) She feels better on the Stiolto, less chest tightness, better breathing.  She does have left antecubital/arm pain that she describes at least in time to being on the Stiolto.  This medication can rarely cause arthralgias.  We discussed possibly making a change but for now she is okay staying on the Stiolto.  If it becomes more problematic then we will reconsider.  Pulmonary nodule Plan to get her into the lung cancer screening program to continue surveillance and screening     Levy Pupa, MD, PhD 01/06/2024, 11:36 AM [facility] [phone] or if no answer [phone]

## 2024-02-09 ENCOUNTER — Other Ambulatory Visit: Payer: Self-pay | Admitting: Nurse Practitioner

## 2024-02-09 DIAGNOSIS — J449 Chronic obstructive pulmonary disease, unspecified: Secondary | ICD-10-CM

## 2024-02-10 ENCOUNTER — Other Ambulatory Visit: Payer: Self-pay | Admitting: Nurse Practitioner

## 2024-02-10 DIAGNOSIS — J449 Chronic obstructive pulmonary disease, unspecified: Secondary | ICD-10-CM

## 2024-05-10 ENCOUNTER — Other Ambulatory Visit: Payer: Self-pay | Admitting: Nurse Practitioner

## 2024-05-10 DIAGNOSIS — J449 Chronic obstructive pulmonary disease, unspecified: Secondary | ICD-10-CM

## 2024-09-13 ENCOUNTER — Other Ambulatory Visit: Payer: Self-pay | Admitting: Obstetrics and Gynecology

## 2024-09-13 DIAGNOSIS — R928 Other abnormal and inconclusive findings on diagnostic imaging of breast: Secondary | ICD-10-CM

## 2024-09-25 ENCOUNTER — Encounter

## 2024-09-25 ENCOUNTER — Other Ambulatory Visit

## 2024-10-10 ENCOUNTER — Ambulatory Visit
Admission: RE | Admit: 2024-10-10 | Discharge: 2024-10-10 | Disposition: A | Source: Ambulatory Visit | Attending: Obstetrics and Gynecology | Admitting: Obstetrics and Gynecology

## 2024-10-10 DIAGNOSIS — R928 Other abnormal and inconclusive findings on diagnostic imaging of breast: Secondary | ICD-10-CM

## 2024-10-11 ENCOUNTER — Encounter: Payer: Self-pay | Admitting: Emergency Medicine

## 2024-10-11 DIAGNOSIS — J449 Chronic obstructive pulmonary disease, unspecified: Secondary | ICD-10-CM

## 2024-10-11 NOTE — Telephone Encounter (Signed)
 Please advise on adverse effect of blood pressure from stiolto

## 2024-10-13 MED ORDER — ALBUTEROL SULFATE HFA 108 (90 BASE) MCG/ACT IN AERS: 1.0000 | INHALATION_SPRAY | Freq: Four times a day (QID) | RESPIRATORY_TRACT | 3 refills | Status: AC | PRN

## 2024-10-13 NOTE — Telephone Encounter (Signed)
 Copied from CRM #[id number]. Topic: Clinical - Medication Question >> Oct 13, 2024  9:34 AM Dedra B wrote: Reason for CRM: Patient called to follow up on request to have Tiotropium Bromide-Olodaterol (STIOLTO RESPIMAT ) 2.5-2.5 MCG/ACT AERS replaced with another inhaler since it was increasing her BP. Send replacement to [facility] on [address]. Patient also sent a MyChart message requesting a call.   Please advise on alternative instead of Stiolto.

## 2024-10-13 NOTE — Telephone Encounter (Signed)
 I am not going to place her on a substitute until we have determined how her blood pressure does off the Stiolto.  All of the medications that we could potentially use can be associated with high blood pressure.  Would be reasonable for her to use albuterol  as needed

## 2024-11-01 ENCOUNTER — Encounter: Payer: Self-pay | Admitting: Emergency Medicine

## 2024-11-01 ENCOUNTER — Telehealth: Payer: Self-pay

## 2024-11-01 ENCOUNTER — Ambulatory Visit (INDEPENDENT_AMBULATORY_CARE_PROVIDER_SITE_OTHER): Admitting: Emergency Medicine

## 2024-11-01 VITALS — BP 112/82 | HR 77 | Ht 63.0 in | Wt 162.0 lb

## 2024-11-01 DIAGNOSIS — R911 Solitary pulmonary nodule: Secondary | ICD-10-CM | POA: Diagnosis not present

## 2024-11-01 DIAGNOSIS — J449 Chronic obstructive pulmonary disease, unspecified: Secondary | ICD-10-CM | POA: Diagnosis not present

## 2024-11-01 DIAGNOSIS — F17201 Nicotine dependence, unspecified, in remission: Secondary | ICD-10-CM

## 2024-11-01 DIAGNOSIS — R053 Chronic cough: Secondary | ICD-10-CM

## 2024-11-01 NOTE — Telephone Encounter (Signed)
 When we discussed in office it looks like the inhaler that had been prescribed for her was Incruse, but based on her report it sounds like it was actually Anoro.  Given her difficulty and hypertension with Stiolto I think she should hold off on starting the Anoro.  Instead lets give her a prescription for Spiriva  Respimat 1.25 mcg, 2 puffs once daily.  Have her keep track of her blood pressure and her breathing on the Spiriva 

## 2024-11-01 NOTE — Telephone Encounter (Unsigned)
 Copied from CRM #[id number]. Topic: Clinical - Medication Question >> Nov 01, 2024  1:21 PM Leila C wrote: Reason for CRM: Patient [phone] states was in earlier today with Dr. Shelah, and spoke about changing inhaler. Patient states she pointed out the wrong inhaler. Patient wants to check with Dr. Fayetta, if patient can use Umeclidinium Ellipta and Vilanterol inhaler powder 62.5mcg/25mcg, patient's pcp prescribed this. Patient states will not start medication, until Dr. Lanny consult. Please advise and call back.

## 2024-11-01 NOTE — Patient Instructions (Signed)
 We reviewed your blood pressure measurements today.  There has been an improvement since she came off the Stiolto. Go ahead and try starting the Incruse once daily to see if you can tolerate it.  Continue to track your blood pressure to ensure that it does not become elevated.  Also keep track of whether you have any increased cough or upper airway irritation symptoms. There are alternatives to Incruse that we could consider if you do have throat irritation. Keep your albuterol  available to use 2 puffs if needed for shortness of breath, chest tightness, wheezing. We will refer you to our lung cancer screening program so you can have repeat chest CT arranged. Please follow in our office in about 1 month.  Call sooner if you have any problems.

## 2024-11-01 NOTE — Progress Notes (Signed)
 "  Subjective:    Patient ID: Deborah May, female    DOB: April 28, 1961, 64 y.o.   MRN: [id number]  COPD She complains of cough and shortness of breath. There is no wheezing. Pertinent negatives include no ear pain, fever, headaches, postnasal drip, rhinorrhea, sneezing, sore throat or trouble swallowing. Her past medical history is significant for COPD.    ROV 11/11/2023  -- 64 year old woman with COPD, some mild bronchiectasis and airway thickening in the anterior middle lobe.  Since I last saw her she was seen for an acute visit and then again in October 2024.  She was started first on Stiolto. For the last month she has had rash, itching especially on her back. She wondered whether the stiolto was causing the problem, so she started decreasing the stiolto, now taking every other day. Never needs albuterol . She is unsure whether the stiolto helped her breathing much. No cough. Good functional capacity. Not on loratadine right now.   ROV 01/06/2024 --Deborah May is 64 with a history of COPD, mild bronchiectasis and airway thickening in the anterior middle lobe.  She has a pruritic rash on her back.  We discussed in February.  She had decreased her Stiolto given some question that the medication may have been the cause.  She has seen dermatology, is now on Dupixent for atopic dermatitis. She believes that it is helping the rash.  She reports today that she is back on the stiolto, has noticed L arm pain since she started back on the stiolto. She thinks it has helped her chest tightness. Her future chest imaging is planned to be lung CA screening.   ROV 11/01/2024 --follow-up visit 64 year old woman with COPD, mild bronchiectasis and airway thickening in the anterior right middle lobe.  She is being treated with Stiolto.  She did get some benefit with regard to short windedness but reports today that she has been experiencing HTN, documented also by Dr Zachery office. She stopped the [facility] 10/05/24 to see if this  was connected. Her surveillance readings seem to have improved - has seen a couple isolated SBP in 180. Her breathing is a bit worse off the Stiolto, has had to use some albuterol . Has had some relief from this.     Review of Systems  Constitutional:  Negative for fever and unexpected weight change.  HENT:  Negative for congestion, dental problem, ear pain, nosebleeds, postnasal drip, rhinorrhea, sinus pressure, sneezing, sore throat and trouble swallowing.   Eyes:  Negative for redness and itching.  Respiratory:  Positive for cough and shortness of breath. Negative for chest tightness and wheezing.   Cardiovascular:  Negative for palpitations and leg swelling.  Gastrointestinal:  Negative for nausea and vomiting.  Genitourinary:  Negative for dysuria.  Musculoskeletal:  Negative for joint swelling.  Skin:  Negative for rash.  Neurological:  Negative for headaches.  Hematological:  Does not bruise/bleed easily.  Psychiatric/Behavioral:  Negative for dysphoric mood. The patient is not nervous/anxious.      Allergies  Allergen Reactions   Cephalosporins    Latex    Morphine And Codeine     Other     Mycins and cyclins per patient    Stiolto Respimat  [Tiotropium Bromide-Olodaterol] Itching   Tetracyclines & Related      Outpatient Medications Prior to Visit  Medication Sig Dispense Refill   albuterol  (PROVENTIL  HFA) 108 (90 Base) MCG/ACT inhaler Inhale 1 puff into the lungs every 6 (six) hours as needed for wheezing or shortness of  breath. 18 g 3   aspirin EC 81 MG tablet Take 81 mg by mouth daily. Swallow whole.     B Complex Vitamins (B-COMPLEX/B-12 PO) Take 1 tablet by mouth daily.     Cholecalciferol (VITAMIN D3) 50 MCG (2000 UT) capsule Take 2,000 Units by mouth daily.     diphenhydrAMINE (BENADRYL) 25 MG tablet Take 25 mg by mouth every 6 (six) hours as needed.     APPLE CIDER VINEGAR PO Take by mouth. (Patient not taking: Reported on 11/01/2024)     clobetasol ointment  (TEMOVATE) 0.05 % daily as needed. (Patient not taking: Reported on 11/01/2024)     ELDERBERRY PO Take by mouth. (Patient not taking: Reported on 11/01/2024)     GARLIC PO Take by mouth. (Patient not taking: Reported on 11/01/2024)     Ginger, Zingiber officinalis, (GINGER PO) Take by mouth. (Patient not taking: Reported on 11/01/2024)     NON FORMULARY Take 500 mg by mouth daily. (Patient not taking: Reported on 11/01/2024)     Turmeric (QC TUMERIC COMPLEX PO) Take by mouth. (Patient not taking: Reported on 11/01/2024)     Dupilumab (DUPIXENT) 300 MG/2ML SOAJ Inject 300 mg into the skin every 14 (fourteen) days.     Tiotropium Bromide-Olodaterol (STIOLTO RESPIMAT ) 2.5-2.5 MCG/ACT AERS INHALE 2 PUFFS BY MOUTH INTO THE LUNGS DAILY 12 g 2   No facility-administered medications prior to visit.        Objective:    Vitals:   11/01/24 1117  BP: 112/82  Pulse: 77  SpO2: 96%  Weight: 162 lb (73.5 kg)  Height: 5' 3 (1.6 m)    Gen: Pleasant, well-nourished, in no distress,  normal affect  ENT: No lesions,  mouth clear,  oropharynx clear, no postnasal drip  Neck: No JVD, no stridor  Lungs: No use of accessory muscles, no crackles or wheezing on normal respiration, no wheeze on forced expiration  Cardiovascular: RRR, heart sounds normal, no murmur or gallops, no peripheral edema  Musculoskeletal: No deformities, no cyanosis or clubbing  Neuro: alert, awake, non focal     Assessment & Plan:  COPD (chronic obstructive pulmonary disease) (HCC) We reviewed your blood pressure measurements today.  There has been an improvement since she came off the Stiolto. Go ahead and try starting the Incruse once daily to see if you can tolerate it.  Continue to track your blood pressure to ensure that it does not become elevated.  Also keep track of whether you have any increased cough or upper airway irritation symptoms. There are alternatives to Incruse that we could consider if you do have throat  irritation. Keep your albuterol  available to use 2 puffs if needed for shortness of breath, chest tightness, wheezing. Please follow in our office in about 1 month.  Call sooner if you have any problems.  Addendum: Patient called to let me know that the inhaler that she had been given was actually Anoro instead of Incruse.  I think it would be more straightforward to start her on LAMA only in order to avoid confusion about which medications are contributing to her hypertension.  I will ask her to hold off on the Anoro and we will send a prescription for Spiriva  Respimat instead.  I will choose the 1.25 mcg dose to avoid side effects if possible.  Pulmonary nodule We will refer you to our lung cancer screening program so you can have repeat chest CT arranged.   I personally spent a total of 30  minutes in the care of the patient today including preparing to see the patient, getting/reviewing separately obtained history, performing a medically appropriate exam/evaluation, counseling and educating, placing orders, documenting clinical information in the EHR, independently interpreting results, and communicating results.    Lamar Chris, MD, PhD 11/01/2024, 5:02 PM [facility] [phone] or if no answer [phone]  "

## 2024-11-01 NOTE — Assessment & Plan Note (Signed)
 We will refer you to our lung cancer screening program so you can have repeat chest CT arranged.

## 2024-11-01 NOTE — Assessment & Plan Note (Signed)
 We reviewed your blood pressure measurements today.  There has been an improvement since she came off the Stiolto. Go ahead and try starting the Incruse once daily to see if you can tolerate it.  Continue to track your blood pressure to ensure that it does not become elevated.  Also keep track of whether you have any increased cough or upper airway irritation symptoms. There are alternatives to Incruse that we could consider if you do have throat irritation. Keep your albuterol  available to use 2 puffs if needed for shortness of breath, chest tightness, wheezing. Please follow in our office in about 1 month.  Call sooner if you have any problems.  Addendum: Patient called to let me know that the inhaler that she had been given was actually Anoro instead of Incruse.  I think it would be more straightforward to start her on LAMA only in order to avoid confusion about which medications are contributing to her hypertension.  I will ask her to hold off on the Anoro and we will send a prescription for Spiriva  Respimat instead.  I will choose the 1.25 mcg dose to avoid side effects if possible.

## 2024-11-02 ENCOUNTER — Other Ambulatory Visit: Payer: Self-pay | Admitting: *Deleted

## 2024-11-02 ENCOUNTER — Telehealth: Payer: Self-pay | Admitting: *Deleted

## 2024-11-02 DIAGNOSIS — Z122 Encounter for screening for malignant neoplasm of respiratory organs: Secondary | ICD-10-CM

## 2024-11-02 DIAGNOSIS — Z87891 Personal history of nicotine dependence: Secondary | ICD-10-CM

## 2024-11-02 MED ORDER — SPIRIVA RESPIMAT 1.25 MCG/ACT IN AERS: 2.0000 | INHALATION_SPRAY | Freq: Every day | RESPIRATORY_TRACT | 5 refills | Status: AC

## 2024-11-02 NOTE — Telephone Encounter (Signed)
 Patient aware of message and medication has been sent

## 2024-11-02 NOTE — Telephone Encounter (Signed)
 Lung Cancer Screening Narrative/Criteria Questionnaire (Cigarette Smokers Only- No Cigars/Pipes/vapes)   Deborah May   SDMV:11/07/24 9:00 [place]                                           Jan 27, 1961              LDCT: 11/15/24 12:00 GI    64 y.o.   Phone: [phone]  Lung Screening Narrative (confirm age 35-77 yrs Medicare / 50-80 yrs Private pay insurance)   Insurance information:[employer] MCR   Referring Provider:[facility]   This screening involves an initial phone call with a team member from our program. It is called a shared decision making visit. The initial meeting is required by insurance and Medicare to make sure you understand the program. This appointment takes about 15-20 minutes to complete. The CT scan will completed at a separate date/time. This scan takes about 5-10 minutes to complete and you may eat and drink before and after the scan.  Criteria questions for Lung Cancer Screening:   Are you a current or former smoker? Former Age began smoking: 13   If you are a former smoker, what year did you quit smoking?  2020 (within 15 yrs)   To calculate your smoking history, I need an accurate estimate of how many packs of cigarettes you smoked per day and for how many years. (Not just the number of PPD you are now smoking)   Years smoking 45 x Packs per day 1 = Pack years 45   (at least 20 pack yrs)   (Make sure they understand that we need to know how much they have smoked in the past, not just the number of PPD they are smoking now)  Do you have a personal history of cancer?  No    Do you have a family history of cancer? Yes  (cancer type and and relative) MGM & Aunt (ovarian)  Are you coughing up blood?  No  Have you had unexplained weight loss of 15 lbs or more in the last 6 months? No  It looks like you meet all criteria.     Additional information: N/A

## 2024-11-03 NOTE — Telephone Encounter (Signed)
 I think this has been addressed -  She thought she was given Incruse, actually had Anoro at home - never started it  I asked her to hold off on Anoro, start Spiriva  Respimat instead

## 2024-11-07 ENCOUNTER — Ambulatory Visit

## 2024-11-07 DIAGNOSIS — Z122 Encounter for screening for malignant neoplasm of respiratory organs: Secondary | ICD-10-CM

## 2024-11-07 DIAGNOSIS — Z87891 Personal history of nicotine dependence: Secondary | ICD-10-CM

## 2024-11-15 ENCOUNTER — Other Ambulatory Visit

## 2024-12-01 ENCOUNTER — Ambulatory Visit: Admitting: Primary Care
